# Patient Record
Sex: Female | Born: 1970 | Race: White | Hispanic: No | Marital: Married | Smoking: Never smoker
Health system: Southern US, Community
[De-identification: ages and names within clinical notes are randomized; demographics above are authoritative.]

## PROBLEM LIST (undated history)

## (undated) DIAGNOSIS — E119 Type 2 diabetes mellitus without complications: Secondary | ICD-10-CM

## (undated) HISTORY — PX: ANTERIOR CRUCIATE LIGAMENT REPAIR: SHX115

## (undated) HISTORY — PX: MENISCUS REPAIR: SHX5179

## (undated) HISTORY — PX: KNEE SURGERY: SHX244

---

## 2009-11-09 ENCOUNTER — Ambulatory Visit: Payer: Self-pay | Admitting: Family Medicine

## 2009-11-09 DIAGNOSIS — F411 Generalized anxiety disorder: Secondary | ICD-10-CM | POA: Insufficient documentation

## 2009-11-09 DIAGNOSIS — B029 Zoster without complications: Secondary | ICD-10-CM | POA: Insufficient documentation
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
## 2011-07-13 ENCOUNTER — Emergency Department
Admission: EM | Admit: 2011-07-13 | Discharge: 2011-07-13 | Discharge status: Absent without leave | Payer: Self-pay | Source: Home / Self Care

## 2012-09-06 ENCOUNTER — Encounter: Payer: Self-pay | Admitting: Emergency Medicine

## 2012-09-06 ENCOUNTER — Emergency Department (INDEPENDENT_AMBULATORY_CARE_PROVIDER_SITE_OTHER)
Admission: EM | Admit: 2012-09-06 | Discharge: 2012-09-06 | Disposition: A | Discharge status: Home / Self Care | Payer: BC Managed Care – PPO | Source: Home / Self Care | Attending: Family Medicine | Admitting: Family Medicine

## 2012-09-06 DIAGNOSIS — J309 Allergic rhinitis, unspecified: Secondary | ICD-10-CM

## 2012-09-06 DIAGNOSIS — J40 Bronchitis, not specified as acute or chronic: Secondary | ICD-10-CM

## 2012-09-06 DIAGNOSIS — J069 Acute upper respiratory infection, unspecified: Secondary | ICD-10-CM

## 2012-09-06 MED ORDER — FLUTICASONE PROPIONATE 50 MCG/ACT NA SUSP: 2.0000 | Freq: Every day | NASAL | Status: DC

## 2012-09-06 MED ORDER — CETIRIZINE HCL 10 MG PO CAPS: 10.0000 mg | ORAL_CAPSULE | Freq: Every day | ORAL | Status: DC

## 2012-09-06 MED ORDER — AZITHROMYCIN 250 MG PO TABS: ORAL_TABLET | ORAL | Status: DC

## 2012-09-06 NOTE — ED Provider Notes (Signed)
History     CSN: [id number]  Arrival date & time 09/06/12  1453   First MD Initiated Contact with Patient 09/06/12 1457      Chief Complaint  Patient presents with  . Cough   HPI  URI Symptoms Onset: 2 weeks  Description: rhinorrhea, nasal congestion, sinus drainage, mild cough  Modifying factors:  None   Symptoms Nasal discharge: yes Fever: no Sore throat: mild Cough: yes Wheezing: no Ear pain: no GI symptoms: no Sick contacts: no  Red Flags  Stiff neck: no Dyspnea: faint Rash: no Swallowing difficulty: no  Sinusitis Risk Factors Headache/face pain: no Double sickening: no tooth pain: no  Allergy Risk Factors Sneezing: yes  Itchy scratchy throat: yes Seasonal symptoms: yes  Flu Risk Factors Headache: no muscle aches: no severe fatigue: no    No past medical history on file.  No past surgical history on file.  No family history on file.  History  Substance Use Topics  . Smoking status: Not on file  . Smokeless tobacco: Not on file  . Alcohol Use: Not on file    OB History   No data available      Review of Systems  All other systems reviewed and are negative.    Allergies  Review of patient's allergies indicates no known allergies.  Home Medications   Current Outpatient Rx  Name  Route  Sig  Dispense  Refill  . citalopram (CELEXA) 40 MG tablet   Oral   Take 40 mg by mouth daily.         Marland Kitchen dextromethorphan (DELSYM) 30 MG/5ML liquid   Oral   Take 60 mg by mouth as needed for cough.         . dextromethorphan-guaiFENesin (MUCINEX DM) 30-600 MG per 12 hr tablet   Oral   Take 1 tablet by mouth every 12 (twelve) hours.           BP 133/82  Pulse 92  Temp(Src) 98.2 F (36.8 C) (Oral)  Ht 5\' 3"  (1.6 m)  SpO2 99%  Physical Exam  Constitutional: She appears well-developed and well-nourished.  HENT:  Head: Normocephalic and atraumatic.  Right Ear: External ear normal.  Left Ear: External ear normal.  +nasal  erythema, rhinorrhea bilaterally, + post oropharyngeal erythema    Eyes: Conjunctivae are normal. Pupils are equal, round, and reactive to light.  Neck: Normal range of motion.  Cardiovascular: Normal rate and regular rhythm.   Pulmonary/Chest: Effort normal and breath sounds normal. She has no wheezes.  Abdominal: Soft. Bowel sounds are normal.  Musculoskeletal: Normal range of motion.  Neurological: She is alert.  Skin: Skin is warm.    ED Course  Procedures (including critical care time)  Labs Reviewed - No data to display No results found.   1. URI (upper respiratory infection)   2. Bronchitis   3. Allergic rhinitis       MDM  Start flonase and zyrtec for allergic sxs.  Zpak for atypical coverage.  Discussed supportive care and infectious red flags.  Follow up as needed.     The patient and/or caregiver has been counseled thoroughly with regard to treatment plan and/or medications prescribed including dosage, schedule, interactions, rationale for use, and possible side effects and they verbalize understanding. Diagnoses and expected course of recovery discussed and will return if not improved as expected or if the condition worsens. Patient and/or caregiver verbalized understanding.  Doree Albee, MD 09/06/12 [phone]

## 2012-09-06 NOTE — ED Notes (Signed)
Congested cough x 2 weeks

## 2012-10-14 ENCOUNTER — Emergency Department (INDEPENDENT_AMBULATORY_CARE_PROVIDER_SITE_OTHER)
Admission: EM | Admit: 2012-10-14 | Discharge: 2012-10-14 | Disposition: A | Discharge status: Home / Self Care | Payer: BC Managed Care – PPO | Source: Home / Self Care | Attending: Family Medicine | Admitting: Family Medicine

## 2012-10-14 ENCOUNTER — Encounter: Payer: Self-pay | Admitting: *Deleted

## 2012-10-14 DIAGNOSIS — H60399 Other infective otitis externa, unspecified ear: Secondary | ICD-10-CM

## 2012-10-14 DIAGNOSIS — H6011 Cellulitis of right external ear: Secondary | ICD-10-CM

## 2012-10-14 MED ORDER — PREDNISONE 20 MG PO TABS: 20.0000 mg | ORAL_TABLET | Freq: Two times a day (BID) | ORAL | Status: DC

## 2012-10-14 MED ORDER — CEPHALEXIN 500 MG PO CAPS: 500.0000 mg | ORAL_CAPSULE | Freq: Three times a day (TID) | ORAL | Status: DC

## 2012-10-14 NOTE — ED Notes (Signed)
Pt c/o right ear pain and soreness x 5 days. Has not tried anything at home.

## 2012-10-14 NOTE — ED Provider Notes (Signed)
History     CSN: [id number]  Arrival date & time 10/14/12  1542   First MD Initiated Contact with Patient 10/14/12 1614      Chief Complaint  Patient presents with  . Otalgia       HPI Comments: Patient complains of 5 day history of right earache.  She notes that the pain is actually in her distal right canal where she has had a cystic lesion that has drained but remains tender.  No fevers, chills, and sweats.  She notes that she has had increased sinus congestion, and now has developed some mild discomfort in her left ear.  She uses Flonase daily.  Patient is a 42 y.o. female presenting with ear drainage. The history is provided by the patient.  Ear Drainage This is a new problem. Episode onset: 5 days ago. The problem occurs daily. The problem has been resolved. Associated symptoms comments: none. Exacerbated by: applying pressure to right ear. Nothing relieves the symptoms. She has tried nothing for the symptoms.    History reviewed. No pertinent past medical history.  Past Surgical History  Procedure Laterality Date  . Cesarean section      Family History  Problem Relation Age of Onset  . Cancer Mother     History  Substance Use Topics  . Smoking status: Never Smoker   . Smokeless tobacco: Not on file  . Alcohol Use: No    OB History   Grav Para Term Preterm Abortions TAB SAB Ect Mult Living                  Review of Systems  All other systems reviewed and are negative.    Allergies  Review of patient's allergies indicates no known allergies.  Home Medications   Current Outpatient Rx  Name  Route  Sig  Dispense  Refill  . azithromycin (ZITHROMAX) 250 MG tablet      Take 2 tabs PO x 1 dose, then 1 tab PO QD x 4 days   6 tablet   0   . cephALEXin (KEFLEX) 500 MG capsule   Oral   Take 1 capsule (500 mg total) by mouth 3 (three) times daily.   30 capsule   0   . Cetirizine HCl 10 MG CAPS   Oral   Take 1 capsule (10 mg total) by mouth daily.  30 capsule   3   . citalopram (CELEXA) 40 MG tablet   Oral   Take 40 mg by mouth daily.         Marland Kitchen dextromethorphan (DELSYM) 30 MG/5ML liquid   Oral   Take 60 mg by mouth as needed for cough.         . dextromethorphan-guaiFENesin (MUCINEX DM) 30-600 MG per 12 hr tablet   Oral   Take 1 tablet by mouth every 12 (twelve) hours.         . fluticasone (FLONASE) 50 MCG/ACT nasal spray   Nasal   Place 2 sprays into the nose daily.   16 g   12   . predniSONE (DELTASONE) 20 MG tablet   Oral   Take 1 tablet (20 mg total) by mouth 2 (two) times daily. Take with food.   10 tablet   0     BP 120/79  Pulse 87  Temp(Src) 97.8 F (36.6 C) (Oral)  Resp 16  Ht 5\' 3"  (1.6 m)  Wt 237 lb 12 oz (107.843 kg)  BMI 42.13 kg/m2  SpO2  97%  Physical Exam  Nursing note and vitals reviewed. Constitutional: She appears well-developed and well-nourished.  Patient is obese (BMI 42.1)  HENT:  Head: Normocephalic and atraumatic.  Right Ear: Tympanic membrane and ear canal normal. There is tenderness.  Left Ear: Tympanic membrane, external ear and ear canal normal.  Ears:  Nose: Nose normal.  Mouth/Throat: Oropharynx is clear and moist.  As noted on diagram, right ear external meatus reveals a 5mm dia excoriation without swelling that is tender to palpation.  Remainder of canal appears normal.  Eyes: Conjunctivae and EOM are normal. Pupils are equal, round, and reactive to light.  Neck: Neck supple.  Lymphadenopathy:    She has no cervical adenopathy.    ED Course  Procedures  none  Labs Reviewed - Tympanogram high peak height bilaterally     1. Cellulitis of ear canal, right       MDM  Begin prednisone burst. Begin Keflex. Apply warm compress to right ear two or three times daily. Followup with ENT if not improved about one week. Note abnormal tympanogram; recommend that she follow-up with ENT for evaluation of her hearing.        Lattie Haw, MD 10/18/12  [phone]

## 2012-10-21 ENCOUNTER — Emergency Department (INDEPENDENT_AMBULATORY_CARE_PROVIDER_SITE_OTHER)
Admission: EM | Admit: 2012-10-21 | Discharge: 2012-10-21 | Disposition: A | Discharge status: Home / Self Care | Payer: BC Managed Care – PPO | Source: Home / Self Care | Attending: Family Medicine | Admitting: Family Medicine

## 2012-10-21 ENCOUNTER — Encounter: Payer: Self-pay | Admitting: *Deleted

## 2012-10-21 DIAGNOSIS — H60399 Other infective otitis externa, unspecified ear: Secondary | ICD-10-CM

## 2012-10-21 DIAGNOSIS — H60391 Other infective otitis externa, right ear: Secondary | ICD-10-CM

## 2012-10-21 MED ORDER — CIPROFLOXACIN-DEXAMETHASONE 0.3-0.1 % OT SUSP: 4.0000 [drp] | Freq: Two times a day (BID) | OTIC | Status: DC

## 2012-10-21 NOTE — ED Notes (Signed)
Pt c/o RT ear drainage and soreness no better from her visit last week. Denies fever.

## 2012-10-21 NOTE — ED Provider Notes (Signed)
History     CSN: [id number]  Arrival date & time 10/21/12  1545   First MD Initiated Contact with Patient 10/21/12 1557      Chief Complaint  Patient presents with  . Ear Drainage       HPI Comments: Patient reports that she has had no improvement while taking Keflex, and still has soreness of her right ear canal.  She continues to have small amount of drainage from right ear.  No fevers, chills, and sweats   Patient is a 42 y.o. female presenting with ear pain. The history is provided by the patient.  Otalgia Location:  Right Behind ear:  No abnormality Quality:  Sore Severity:  Mild Duration: since last visit. Timing:  Constant Progression:  Unchanged Chronicity:  Chronic Relieved by:  Nothing Associated symptoms: ear discharge and rhinorrhea   Associated symptoms: no fever, no headaches and no hearing loss     History reviewed. No pertinent past medical history.  Past Surgical History  Procedure Laterality Date  . Cesarean section      Family History  Problem Relation Age of Onset  . Cancer Mother     History  Substance Use Topics  . Smoking status: Never Smoker   . Smokeless tobacco: Not on file  . Alcohol Use: No    OB History   Grav Para Term Preterm Abortions TAB SAB Ect Mult Living                  Review of Systems  Constitutional: Negative for fever.  HENT: Positive for ear pain, rhinorrhea and ear discharge. Negative for hearing loss.   Neurological: Negative for headaches.  All other systems reviewed and are negative.    Allergies  Review of patient's allergies indicates no known allergies.  Home Medications   Current Outpatient Rx  Name  Route  Sig  Dispense  Refill  . azithromycin (ZITHROMAX) 250 MG tablet      Take 2 tabs PO x 1 dose, then 1 tab PO QD x 4 days   6 tablet   0   . cephALEXin (KEFLEX) 500 MG capsule   Oral   Take 1 capsule (500 mg total) by mouth 3 (three) times daily.   30 capsule   0   . Cetirizine HCl 10  MG CAPS   Oral   Take 1 capsule (10 mg total) by mouth daily.   30 capsule   3   . ciprofloxacin-dexamethasone (CIPRODEX) otic suspension   Right Ear   Place 4 drops into the right ear 2 (two) times daily. Use for one week.   7.5 mL   0   . citalopram (CELEXA) 40 MG tablet   Oral   Take 40 mg by mouth daily.         Marland Kitchen dextromethorphan (DELSYM) 30 MG/5ML liquid   Oral   Take 60 mg by mouth as needed for cough.         . dextromethorphan-guaiFENesin (MUCINEX DM) 30-600 MG per 12 hr tablet   Oral   Take 1 tablet by mouth every 12 (twelve) hours.         . fluticasone (FLONASE) 50 MCG/ACT nasal spray   Nasal   Place 2 sprays into the nose daily.   16 g   12   . predniSONE (DELTASONE) 20 MG tablet   Oral   Take 1 tablet (20 mg total) by mouth 2 (two) times daily. Take with food.   10  tablet   0     BP 123/79  Pulse 98  Temp(Src) 97.7 F (36.5 C) (Oral)  Resp 18  Ht 5\' 3"  (1.6 m)  Wt 237 lb (107.502 kg)  BMI 41.99 kg/m2  SpO2 97%  LMP 10/13/2012  Physical Exam Nursing notes and Vital Signs reviewed. Appearance:  Patient appears stated age, and in no acute distress.  Patient is obese (BMI 42.0) Eyes:  Pupils are equal, round, and reactive to light and accomodation.  Extraocular movement is intact.  Conjunctivae are not inflamed  Right ear:  Whitish exudate at outer one third of canal.  There is tenderness with insertion of speculum. No lesions noted.  No edema of canal.  Right tympanic membrane normal.  Nose:   Normal  Pharynx:  Normal Neck:  Supple.  No adenopathy Skin:  No rash present.   ED Course  Procedures  none      1. Otitis, externa, infective, right       MDM  Begin Ciprodex Otic susp.  Culture pending Followup with ENT if not resolved one week.        Lattie Haw, MD 10/21/12 [phone]

## 2012-10-26 LAB — WOUND CULTURE
Gram Stain: NONE SEEN
Gram Stain: NONE SEEN

## 2012-10-27 ENCOUNTER — Telehealth: Payer: Self-pay | Admitting: *Deleted

## 2013-01-25 DIAGNOSIS — M5418 Radiculopathy, sacral and sacrococcygeal region: Secondary | ICD-10-CM | POA: Insufficient documentation

## 2013-01-25 DIAGNOSIS — M5136 Other intervertebral disc degeneration, lumbar region: Secondary | ICD-10-CM | POA: Insufficient documentation

## 2015-10-15 DIAGNOSIS — K219 Gastro-esophageal reflux disease without esophagitis: Secondary | ICD-10-CM | POA: Insufficient documentation

## 2015-10-15 DIAGNOSIS — R609 Edema, unspecified: Secondary | ICD-10-CM | POA: Insufficient documentation

## 2015-10-15 DIAGNOSIS — E1169 Type 2 diabetes mellitus with other specified complication: Secondary | ICD-10-CM | POA: Insufficient documentation

## 2016-04-11 DIAGNOSIS — E1165 Type 2 diabetes mellitus with hyperglycemia: Secondary | ICD-10-CM | POA: Insufficient documentation

## 2016-06-09 ENCOUNTER — Emergency Department (INDEPENDENT_AMBULATORY_CARE_PROVIDER_SITE_OTHER): Payer: BLUE CROSS/BLUE SHIELD

## 2016-06-09 ENCOUNTER — Encounter: Payer: Self-pay | Admitting: Emergency Medicine

## 2016-06-09 ENCOUNTER — Emergency Department (INDEPENDENT_AMBULATORY_CARE_PROVIDER_SITE_OTHER)
Admission: EM | Admit: 2016-06-09 | Discharge: 2016-06-09 | Disposition: A | Discharge status: Home / Self Care | Payer: BLUE CROSS/BLUE SHIELD | Source: Home / Self Care | Attending: Family Medicine | Admitting: Family Medicine

## 2016-06-09 DIAGNOSIS — I517 Cardiomegaly: Secondary | ICD-10-CM

## 2016-06-09 DIAGNOSIS — J069 Acute upper respiratory infection, unspecified: Secondary | ICD-10-CM

## 2016-06-09 DIAGNOSIS — R053 Chronic cough: Secondary | ICD-10-CM

## 2016-06-09 DIAGNOSIS — R05 Cough: Secondary | ICD-10-CM

## 2016-06-09 MED ORDER — BENZONATATE 100 MG PO CAPS: 100.0000 mg | ORAL_CAPSULE | Freq: Three times a day (TID) | ORAL | 0 refills | Status: DC

## 2016-06-09 MED ORDER — AZITHROMYCIN 250 MG PO TABS: 250.0000 mg | ORAL_TABLET | Freq: Every day | ORAL | 0 refills | Status: DC

## 2016-06-09 NOTE — ED Provider Notes (Signed)
CSN: [id number]     Arrival date & time 06/09/16  1005 History   First MD Initiated Contact with Patient 06/09/16 1128     Chief Complaint  Patient presents with  . Cough   (Consider location/radiation/quality/duration/timing/severity/associated sxs/prior Treatment) HPI Molly Andrews is a 45 y.o. female presenting to UC with c/o mild to moderate intermittent dry cough that is occasionally productive for about 6 weeks. She notes she initially had a lot of congestion and fever when symptoms first started, she started to improve and thought her symptoms had resolved, however, her cough has persisted.  Denies recent fever, chills, n/v/d over the last 1 week.  She has tried OTC cough/cold medication with mild temporary relief.    History reviewed. No pertinent past medical history. Past Surgical History:  Procedure Laterality Date  . CESAREAN SECTION     Family History  Problem Relation Age of Onset  . Cancer Mother    Social History  Substance Use Topics  . Smoking status: Never Smoker  . Smokeless tobacco: Never Used  . Alcohol use No   OB History    No data available     Review of Systems  Constitutional: Negative for chills and fever.  HENT: Positive for congestion. Negative for ear pain, sore throat, trouble swallowing and voice change.   Respiratory: Positive for cough. Negative for shortness of breath.   Cardiovascular: Negative for chest pain and palpitations.  Gastrointestinal: Negative for abdominal pain, diarrhea, nausea and vomiting.  Musculoskeletal: Negative for arthralgias, back pain and myalgias.  Skin: Negative for rash.    Allergies  Patient has no known allergies.  Home Medications   Prior to Admission medications   Medication Sig Start Date End Date Taking? Authorizing Provider  azithromycin (ZITHROMAX) 250 MG tablet Take 1 tablet (250 mg total) by mouth daily. Take first 2 tablets together, then 1 every day until finished. 06/09/16   Junius FinnerErin  O'Malley, PA-C  benzonatate (TESSALON) 100 MG capsule Take 1-2 capsules (100-200 mg total) by mouth every 8 (eight) hours. 06/09/16   Junius FinnerErin O'Malley, PA-C  Cetirizine HCl 10 MG CAPS Take 1 capsule (10 mg total) by mouth daily. 09/06/12   Floydene FlockSteven J Newton, MD  ciprofloxacin-dexamethasone [facility](CIPRODEX) otic suspension Place 4 drops into the right ear 2 (two) times daily. Use for one week. 10/21/12   Lattie HawStephen A Beese, MD  dextromethorphan (DELSYM) 30 MG/5ML liquid Take 60 mg by mouth as needed for cough.    Historical Provider, MD  dextromethorphan-guaiFENesin (MUCINEX DM) 30-600 MG per 12 hr tablet Take 1 tablet by mouth every 12 (twelve) hours.    Historical Provider, MD  fluticasone (FLONASE) 50 MCG/ACT nasal spray Place 2 sprays into the nose daily. 09/06/12   Floydene FlockSteven J Newton, MD  predniSONE (DELTASONE) 20 MG tablet Take 1 tablet (20 mg total) by mouth 2 (two) times daily. Take with food. 10/14/12   Lattie HawStephen A Beese, MD   Meds Ordered and Administered this Visit  Medications - No data to display  BP 137/80 (BP Location: Left Arm)   Pulse 87   Temp 98.1 F (36.7 C) (Oral)   Ht 5\' 3"  (1.6 m)   Wt 256 lb (116.1 kg)   LMP 06/07/2016 (Exact Date)   SpO2 97%   BMI 45.35 kg/m  No data found.   Physical Exam  Constitutional: She appears well-developed and well-nourished. No distress.  HENT:  Head: Normocephalic and atraumatic.  Right Ear: Tympanic membrane normal.  Left Ear: Tympanic membrane normal.  Nose: Nose normal.  Mouth/Throat: Uvula is midline, oropharynx is clear and moist and mucous membranes are normal.  Eyes: Conjunctivae are normal. No scleral icterus.  Neck: Normal range of motion. Neck supple.  Cardiovascular: Normal rate, regular rhythm and normal heart sounds.   Pulmonary/Chest: Effort normal and breath sounds normal. No stridor. No respiratory distress. She has no wheezes. She has no rales.  Musculoskeletal: Normal range of motion.  Lymphadenopathy:    She has no cervical  adenopathy.  Neurological: She is alert.  Skin: Skin is warm and dry. She is not diaphoretic.  Nursing note and vitals reviewed.   Urgent Care Course   Clinical Course     Procedures (including critical care time)  Labs Review Labs Reviewed - No data to display  Imaging Review Dg Chest 2 View  Result Date: 06/09/2016 CLINICAL DATA:  Cough. EXAM: CHEST  2 VIEW COMPARISON:  No prior. FINDINGS: Mediastinum and hilar structures normal. Lungs are clear. Cardiomegaly with normal pulmonary vascularity. No pleural effusion or pneumothorax . IMPRESSION: Cardiomegaly with normal pulmonary vascularity. No acute pulmonary disease. Electronically Signed   By: Maisie Fushomas  Register   On: 06/09/2016 11:45    MDM   1. Upper respiratory tract infection, unspecified type   2. Persistent cough for 3 weeks or longer   3. Cardiomegaly    Pt c/o persistent cough for about 6 weeks following fever and congestion initially.    CXR: no evidence of pneumonia, however, there is evidence of cardiomegaly.  Pt denies known hx of enlarged heart. Denies chest pain or SOB.   Encouraged to f/u with PCP about further evaluation of cardiomegaly.  For cough, due to reports of initial fever and congestion, will cover for atypical bacterial cause. Rx: Azithromycin and tessalon    Junius Finnerrin O'Malley, PA-C 06/09/16 1245

## 2016-06-09 NOTE — ED Triage Notes (Signed)
Dry cough x 6 weeks 

## 2017-02-02 ENCOUNTER — Encounter: Payer: Self-pay | Admitting: *Deleted

## 2017-02-02 ENCOUNTER — Emergency Department
Admission: EM | Admit: 2017-02-02 | Discharge: 2017-02-02 | Disposition: A | Discharge status: Home / Self Care | Payer: BLUE CROSS/BLUE SHIELD | Source: Home / Self Care | Attending: Family Medicine | Admitting: Family Medicine

## 2017-02-02 DIAGNOSIS — J069 Acute upper respiratory infection, unspecified: Secondary | ICD-10-CM

## 2017-02-02 DIAGNOSIS — B9789 Other viral agents as the cause of diseases classified elsewhere: Secondary | ICD-10-CM | POA: Diagnosis not present

## 2017-02-02 DIAGNOSIS — R0981 Nasal congestion: Secondary | ICD-10-CM

## 2017-02-02 HISTORY — DX: Type 2 diabetes mellitus without complications: E11.9

## 2017-02-02 MED ORDER — PREDNISONE 20 MG PO TABS: ORAL_TABLET | ORAL | 0 refills | Status: DC

## 2017-02-02 MED ORDER — GUAIFENESIN ER 600 MG PO TB12
600.0000 mg | ORAL_TABLET | Freq: Two times a day (BID) | ORAL | 0 refills | Status: DC | PRN
Start: 2017-02-02 — End: 2017-11-13

## 2017-02-02 MED ORDER — AZITHROMYCIN 250 MG PO TABS: 250.0000 mg | ORAL_TABLET | Freq: Every day | ORAL | 0 refills | Status: DC

## 2017-02-02 MED ORDER — IPRATROPIUM BROMIDE 0.06 % NA SOLN: 2.0000 | Freq: Four times a day (QID) | NASAL | 2 refills | Status: DC

## 2017-02-02 NOTE — ED Triage Notes (Signed)
Pt c/o nasal congestion, sinus pressure, sneezing, and HA x 3 days with some minimal coughing today. Denies fever.

## 2017-02-02 NOTE — ED Provider Notes (Signed)
[name]    CSN: [id number] Arrival date & time: 02/02/17  1654   History   Chief Complaint Chief Complaint  Patient presents with  . Nasal Congestion    HPI Molly Andrews is a 46 y.o. female.   HPI  Molly Andrews is a 46 y.o. female presenting to UC with c/o gradually worsening nasal congestion with sinus pressure, sneezing, post-nasal drip and mild intermittent cough for 3 days.  She tried OTC Coricidin but no relief yesterday so she took Advil and used Afrin with mild relief. Pt states the Afrin caused her nose to burn. Denies fever, chills, n/v/d. Friends have been sick but no one in her household.    Past Medical History:  Diagnosis Date  . Diabetes mellitus without complication [facility])     Patient Active Problem List   Diagnosis Date Noted  . 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 ANXIETY 11/09/2009    Past Surgical History:  Procedure Laterality Date  . CESAREAN SECTION      OB History    No data available       Home Medications    Prior to Admission medications   Medication Sig Start Date End Date Taking? Authorizing Provider  azithromycin (ZITHROMAX) 250 MG tablet Take 1 tablet (250 mg total) by mouth daily. Take first 2 tablets together, then 1 every day until finished. 02/02/17   Lurene Shadow, PA-C  Cetirizine HCl 10 MG CAPS Take 1 capsule (10 mg total) by mouth daily. 09/06/12   Floydene Flock, MD  fluticasone (FLONASE) 50 MCG/ACT nasal spray Place 2 sprays into the nose daily. 09/06/12   Floydene Flock, MD  guaiFENesin (MUCINEX) 600 MG 12 hr tablet Take 1-2 tablets (600-1,200 mg total) by mouth 2 (two) times daily as needed for cough or to loosen phlegm. 02/02/17   Lurene Shadow, PA-C  ipratropium (ATROVENT) 0.06 % nasal spray Place 2 sprays into both nostrils 4 (four) times daily. 02/02/17   Lurene Shadow, PA-C  predniSONE (DELTASONE) 20 MG tablet 3 tabs po day one, then 2 po daily x 4 days 02/02/17   Lurene Shadow, PA-C    Family  History Family History  Problem Relation Age of Onset  . Cancer Mother     Social History Social History  Substance Use Topics  . Smoking status: Never Smoker  . Smokeless tobacco: Never Used  . Alcohol use Yes     Comment: 4 q wk     Allergies   Patient has no known allergies.   Review of Systems Review of Systems  Constitutional: Negative for chills and fever.  HENT: Positive for congestion, postnasal drip, sinus pain, sinus pressure and sore throat. Negative for ear pain, trouble swallowing and voice change.   Respiratory: Positive for cough. Negative for shortness of breath.   Cardiovascular: Negative for chest pain and palpitations.  Gastrointestinal: Negative for abdominal pain, diarrhea, nausea and vomiting.  Musculoskeletal: Negative for arthralgias, back pain and myalgias.  Skin: Negative for rash.  Neurological: Positive for headaches. Negative for dizziness and light-headedness.     Physical Exam Triage Vital Signs ED Triage Vitals  Enc Vitals Group     BP 02/02/17 1726 131/83     Pulse Rate 02/02/17 1726 99     Resp 02/02/17 1726 18     Temp 02/02/17 1726 97.7 F (36.5 C)     Temp Source 02/02/17 1726 Oral     SpO2 02/02/17 1726 98 %  Weight 02/02/17 1728 259 lb (117.5 kg)     Height 02/02/17 1728 5\' 3"  (1.6 m)     Head Circumference --      Peak Flow --      Pain Score 02/02/17 1728 0     Pain Loc --      Pain Edu? --      Excl. in GC? --    No data found.   Updated Vital Signs BP 131/83 (BP Location: Left Arm)   Pulse 99   Temp 97.7 F (36.5 C) (Oral)   Resp 18   Ht 5\' 3"  (1.6 m)   Wt 259 lb (117.5 kg)   LMP 01/09/2017   SpO2 98%   BMI 45.88 kg/m      Physical Exam  Constitutional: She is oriented to person, place, and time. She appears well-developed and well-nourished. No distress.  HENT:  Head: Normocephalic and atraumatic.  Right Ear: Tympanic membrane normal.  Left Ear: Tympanic membrane normal.  Nose: Mucosal edema  present. Right sinus exhibits no maxillary sinus tenderness and no frontal sinus tenderness. Left sinus exhibits no maxillary sinus tenderness and no frontal sinus tenderness.  Mouth/Throat: Uvula is midline, oropharynx is clear and moist and mucous membranes are normal.  Eyes: EOM are normal.  Neck: Normal range of motion. Neck supple.  Cardiovascular: Normal rate and regular rhythm.   Pulmonary/Chest: Effort normal and breath sounds normal. No stridor. No respiratory distress. She has no wheezes. She has no rales.  Musculoskeletal: Normal range of motion.  Lymphadenopathy:    She has no cervical adenopathy.  Neurological: She is alert and oriented to person, place, and time.  Skin: Skin is warm and dry. She is not diaphoretic.  Psychiatric: She has a normal mood and affect. Her behavior is normal.  Nursing note and vitals reviewed.    UC Treatments / Results  Labs (all labs ordered are listed, but only abnormal results are displayed) Labs Reviewed - No data to display  EKG  EKG Interpretation None       Radiology No results found.  Procedures Procedures (including critical care time)  Medications Ordered in UC Medications - No data to display   Initial Impression / Assessment and Plan / UC Course  I have reviewed the triage vital signs and the nursing notes.  Pertinent labs & imaging results that were available during my care of the patient were reviewed by me and considered in my medical decision making (see chart for details).     Hx and exam c/w viral illness. encouraged symptomatic treatment.   Final Clinical Impressions(s) / UC Diagnoses   Final diagnoses:  Viral URI with cough  Nasal congestion   Prescription to hold with expiration date for azithromycin. Pt to fill if persistent fever develops or not improving in 1 week.  F/u with PCP in 7-10 days if not improving.   New Prescriptions Discharge Medication List as of 02/02/2017  5:39 PM    START  taking these medications   Details  azithromycin (ZITHROMAX) 250 MG tablet Take 1 tablet (250 mg total) by mouth daily. Take first 2 tablets together, then 1 every day until finished., Starting Tue 02/02/2017, Print    guaiFENesin (MUCINEX) 600 MG 12 hr tablet Take 1-2 tablets (600-1,200 mg total) by mouth 2 (two) times daily as needed for cough or to loosen phlegm., Starting Tue 02/02/2017, Normal    ipratropium (ATROVENT) 0.06 % nasal spray Place 2 sprays into both nostrils 4 (four)  times daily., Starting Tue 02/02/2017, Normal    predniSONE (DELTASONE) 20 MG tablet 3 tabs po day one, then 2 po daily x 4 days, Normal         Controlled Substance Prescriptions [place] Controlled Substance Registry consulted? Not Applicable   Rolla Plate 02/02/17 1743

## 2017-02-02 NOTE — Discharge Instructions (Signed)
°  Your symptoms are likely due to a virus such as the common cold, however, if you developing worsening chest congestion with shortness of breath, persistent fever (>100.4*F) for 3 days, or symptoms not improving in 4-5 days despite symptomatic treatment, you may fill the antibiotic (azithromycin).  If you do fill the antibiotic,  please take antibiotics as prescribed and be sure to complete entire course even if you start to feel better to ensure infection does not come back.  You may take 500mg  acetaminophen every 4-6 hours or in combination with ibuprofen 400-600mg  every 6-8 hours as needed for pain, inflammation, and fever.  Be sure to drink at least eight 8oz glasses of water to stay well hydrated and get at least 8 hours of sleep at night, preferably more while sick.

## 2017-11-13 ENCOUNTER — Encounter: Payer: Self-pay | Admitting: Emergency Medicine

## 2017-11-13 ENCOUNTER — Emergency Department
Admission: EM | Admit: 2017-11-13 | Discharge: 2017-11-13 | Disposition: A | Discharge status: Home / Self Care | Payer: BLUE CROSS/BLUE SHIELD | Source: Home / Self Care | Attending: Family Medicine | Admitting: Family Medicine

## 2017-11-13 DIAGNOSIS — L299 Pruritus, unspecified: Secondary | ICD-10-CM | POA: Diagnosis not present

## 2017-11-13 LAB — POCT FASTING CBG KUC MANUAL ENTRY: POCT Glucose (KUC): 215 mg/dL — AB (ref 70–99)

## 2017-11-13 MED ORDER — CETIRIZINE HCL 10 MG PO TABS: 10.0000 mg | ORAL_TABLET | Freq: Every day | ORAL | 0 refills | Status: DC

## 2017-11-13 NOTE — ED Provider Notes (Signed)
[name]    CSN: [id number] Arrival date & time: 11/13/17  1253     History   Chief Complaint Chief Complaint  Patient presents with  . Rash    HPI Gurnoor E Kervin is a 47 y.o. female.   HPI  Mylinda Brook Laymon is a 47 y.o. female presenting to UC with c/o moderate to severe itching on the bottoms of her hands and feet for 2 days.  She was outside yesterday and noticed a few mosquito bites on her fingers but she notes the itching on the bottom of her feet is most severe.  She took benadryl 1 hour PTA, which seems to be helping but she has not tried any other medications until today.  She notes she was started on Farxiga 5mg  about 3 weeks ago for her diabetes and wonders if that could be contributing to her itching.  Denies other new soaps, lotions, or medications.  Her sugar has been running in the 200s the last few weeks, which is high for her.  She has not checked her sugar today and has not had anything to eat today, just coffee.     Past Medical History:  Diagnosis Date  . Diabetes mellitus without complication [facility])     Patient Active Problem List   Diagnosis Date Noted  . 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 ANXIETY 11/09/2009    Past Surgical History:  Procedure Laterality Date  . ANTERIOR CRUCIATE LIGAMENT REPAIR    . CESAREAN SECTION    . KNEE SURGERY    . MENISCUS REPAIR      OB History   None      Home Medications    Prior to Admission medications   Medication Sig Start Date End Date Taking? Authorizing Provider  citalopram (CELEXA) 40 MG tablet  10/15/17  Yes [provider]  FARXIGA 5 MG TABS tablet  10/15/17  Yes [provider]  cetirizine (ZYRTEC) 10 MG tablet Take 1 tablet (10 mg total) by mouth daily. 11/13/17   Lurene Shadow, PA-C    Family History Family History  Problem Relation Age of Onset  . Cancer Mother     Social History Social History   Tobacco Use  . Smoking status: Never Smoker  . Smokeless tobacco:  Never Used  Substance Use Topics  . Alcohol use: Yes    Comment: 4 q wk  . Drug use: No     Allergies   Patient has no known allergies.   Review of Systems Review of Systems  Constitutional: Negative for chills and fever.  Musculoskeletal: Negative for arthralgias, joint swelling and myalgias.  Skin: Negative for color change and wound.       Itching on palms and soles     Physical Exam Triage Vital Signs ED Triage Vitals  Enc Vitals Group     BP 11/13/17 1354 133/83     Pulse Rate 11/13/17 1354 73     Resp --      Temp 11/13/17 1354 98.2 F (36.8 C)     Temp Source 11/13/17 1354 Oral     SpO2 11/13/17 1354 98 %     Weight 11/13/17 1355 245 lb 8 oz (111.4 kg)     Height 11/13/17 1355 5\' 3"  (1.6 m)     Head Circumference --      Peak Flow --      Pain Score 11/13/17 1355 0     Pain Loc --  Pain Edu? --      Excl. in GC? --    No data found.  Updated Vital Signs BP 133/83 (BP Location: Right Arm)   Pulse 73   Temp 98.2 F (36.8 C) (Oral)   Ht 5\' 3"  (1.6 m)   Wt 245 lb 8 oz (111.4 kg)   LMP 10/13/2017   SpO2 98%   BMI 43.49 kg/m   Visual Acuity Right Eye Distance:   Left Eye Distance:   Bilateral Distance:    Right Eye Near:   Left Eye Near:    Bilateral Near:     Physical Exam  Constitutional: She is oriented to person, place, and time. She appears well-developed and well-nourished. No distress.  HENT:  Head: Normocephalic and atraumatic.  Eyes: EOM are normal.  Neck: Normal range of motion.  Cardiovascular: Normal rate.  Pulmonary/Chest: Effort normal.  Musculoskeletal: Normal range of motion. She exhibits no edema or tenderness.  Neurological: She is alert and oriented to person, place, and time.  Skin: Skin is warm and dry. Capillary refill takes less than 2 seconds. She is not diaphoretic. No erythema.  Bilateral hands: two erythematous papules on Right ring and little finger c/w insect bites but not in web spaces. No other rashes or  lesions. Skin in tact. No bleeding or discharge.  Bilateral feet: skin in tact. No erythema or ecchymosis. Dried skin on soles for feet  Psychiatric: She has a normal mood and affect. Her behavior is normal.  Nursing note and vitals reviewed.    UC Treatments / Results  Labs (all labs ordered are listed, but only abnormal results are displayed) Labs Reviewed  POCT FASTING CBG [facility] MANUAL ENTRY - Abnormal; Notable for the following components:      Result Value   POCT Glucose ([facility]) 215 (*)    All other components within normal limits    EKG None  Radiology No results found.  Procedures Procedures (including critical care time)  Medications Ordered in UC Medications - No data to display  Initial Impression / Assessment and Plan / UC Course  I have reviewed the triage vital signs and the nursing notes.  Pertinent labs & imaging results that were available during my care of the patient were reviewed by me and considered in my medical decision making (see chart for details).     No evidence of underlying bacterial infection. No rash on feet or palms of hands. Per UpToDate, itching is not likely a side effect of new medication Farxiga. No emergent or urgent process taking place/ Home care instructions provided below.   Final Clinical Impressions(s) / UC Diagnoses   Final diagnoses:  Itching     Discharge Instructions      Please start taking the antihistamine, cetirizine, as prescribed to see if this helps with the itching.  Also, make sure your skin is moisturized and you stay well hydrated.  Dried skin can become quite itching and scratching only continues the cycle of dryness and itching.   Please follow up with your family doctor in the next 1-2 weeks for recheck of symptoms, especially if you believe your sugars are not under control with the new medication.    ED Prescriptions    Medication Sig Dispense Auth. Provider   cetirizine (ZYRTEC) 10 MG tablet Take 1  tablet (10 mg total) by mouth daily. 30 tablet Lurene ShadowPhelps, Crysten Kaman O, PA-C     Controlled Substance Prescriptions [place] Controlled Substance Registry consulted? Not Applicable   Doroteo GlassmanPhelps,  Vangie Bicker, PA-C 11/13/17 1655

## 2017-11-13 NOTE — ED Triage Notes (Signed)
Patient c/o severe itching on the bottoms of her feet and hands x 2 days.  The only thing different patient has changed is a new medication called Farxiga 5mg  about 3 weeks ago.  Blood sugars are running high.

## 2017-11-13 NOTE — Discharge Instructions (Addendum)
°  Please start taking the antihistamine, cetirizine, as prescribed to see if this helps with the itching.  Also, make sure your skin is moisturized and you stay well hydrated.  Dried skin can become quite itching and scratching only continues the cycle of dryness and itching.   Please follow up with your family doctor in the next 1-2 weeks for recheck of symptoms, especially if you believe your sugars are not under control with the new medication.

## 2017-11-18 DIAGNOSIS — M7989 Other specified soft tissue disorders: Secondary | ICD-10-CM | POA: Insufficient documentation

## 2018-03-01 IMAGING — DX DG CHEST 2V
2 series · 2 of 2 positions shown · non-contrast
Comparison: No prior.

CLINICAL DATA: Cough.

EXAM:
CHEST  2 VIEW

[chest pa]
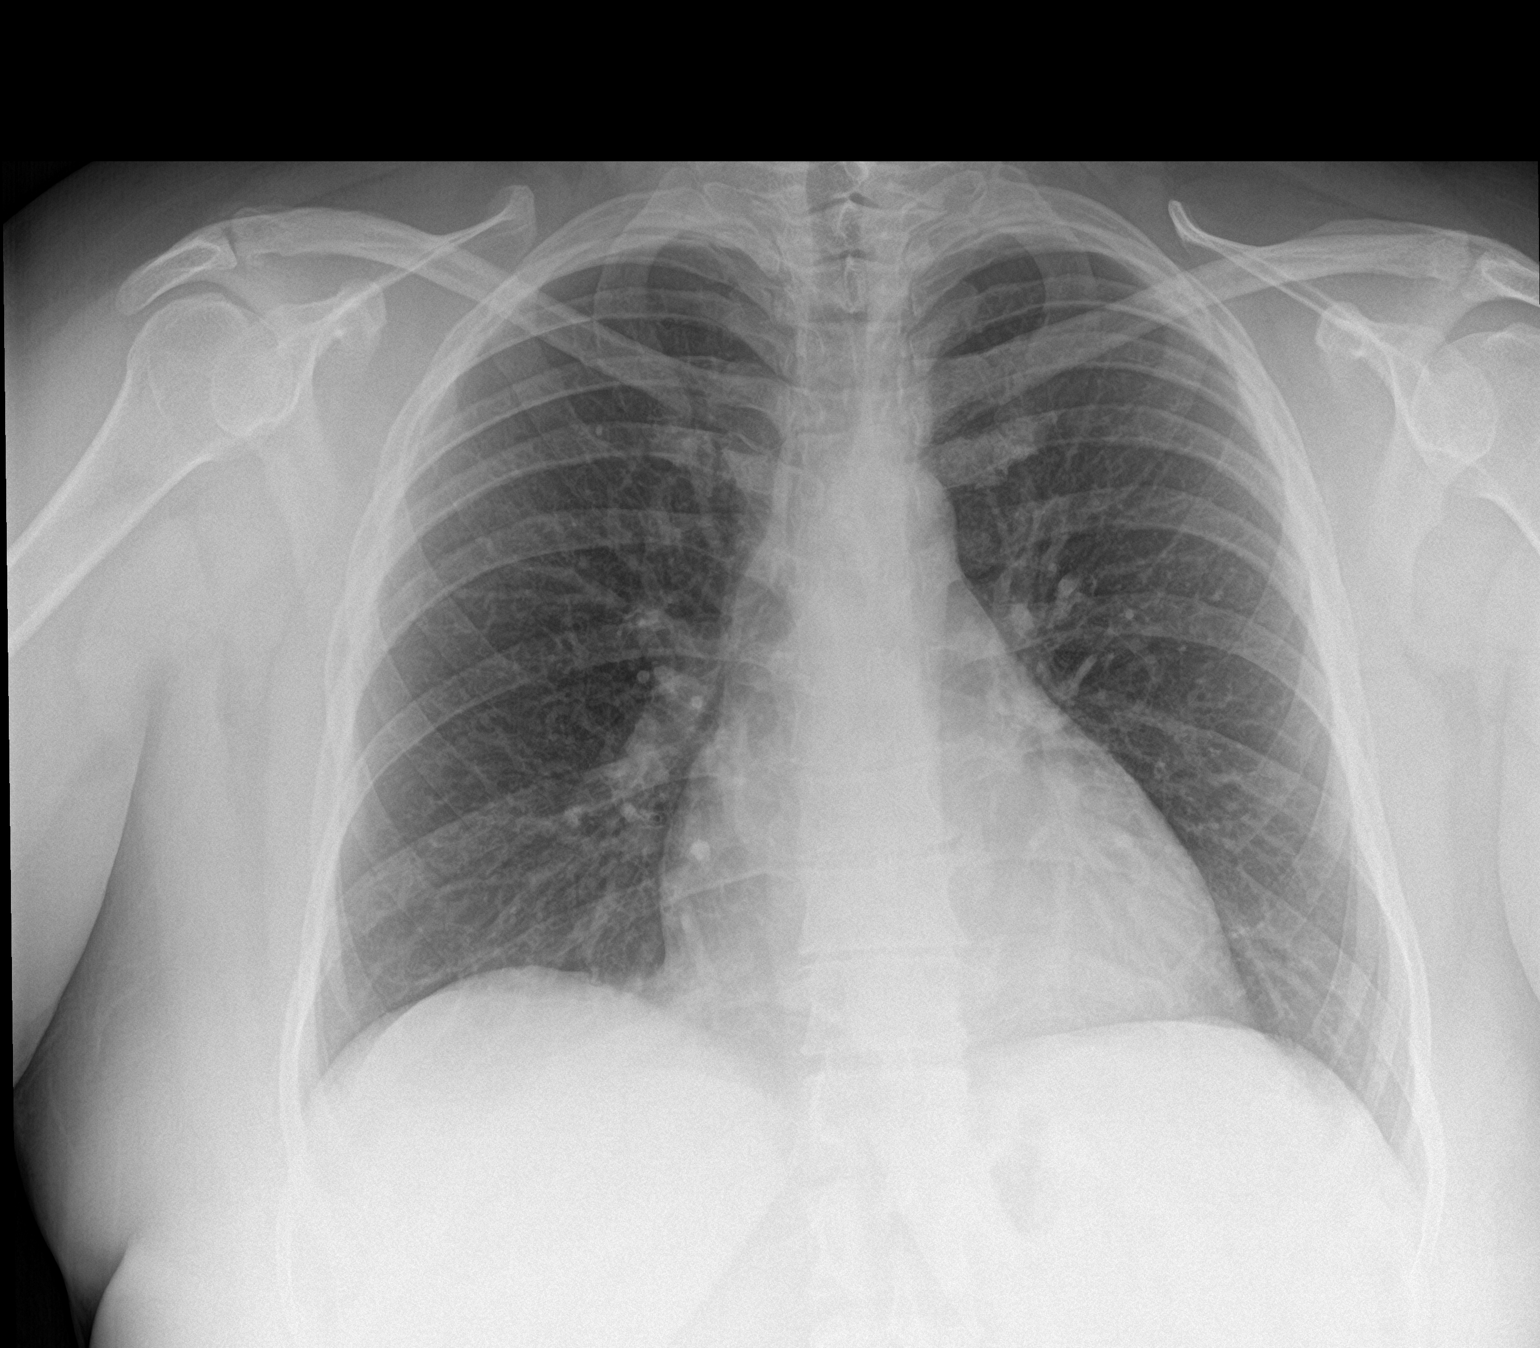

[chest lat]
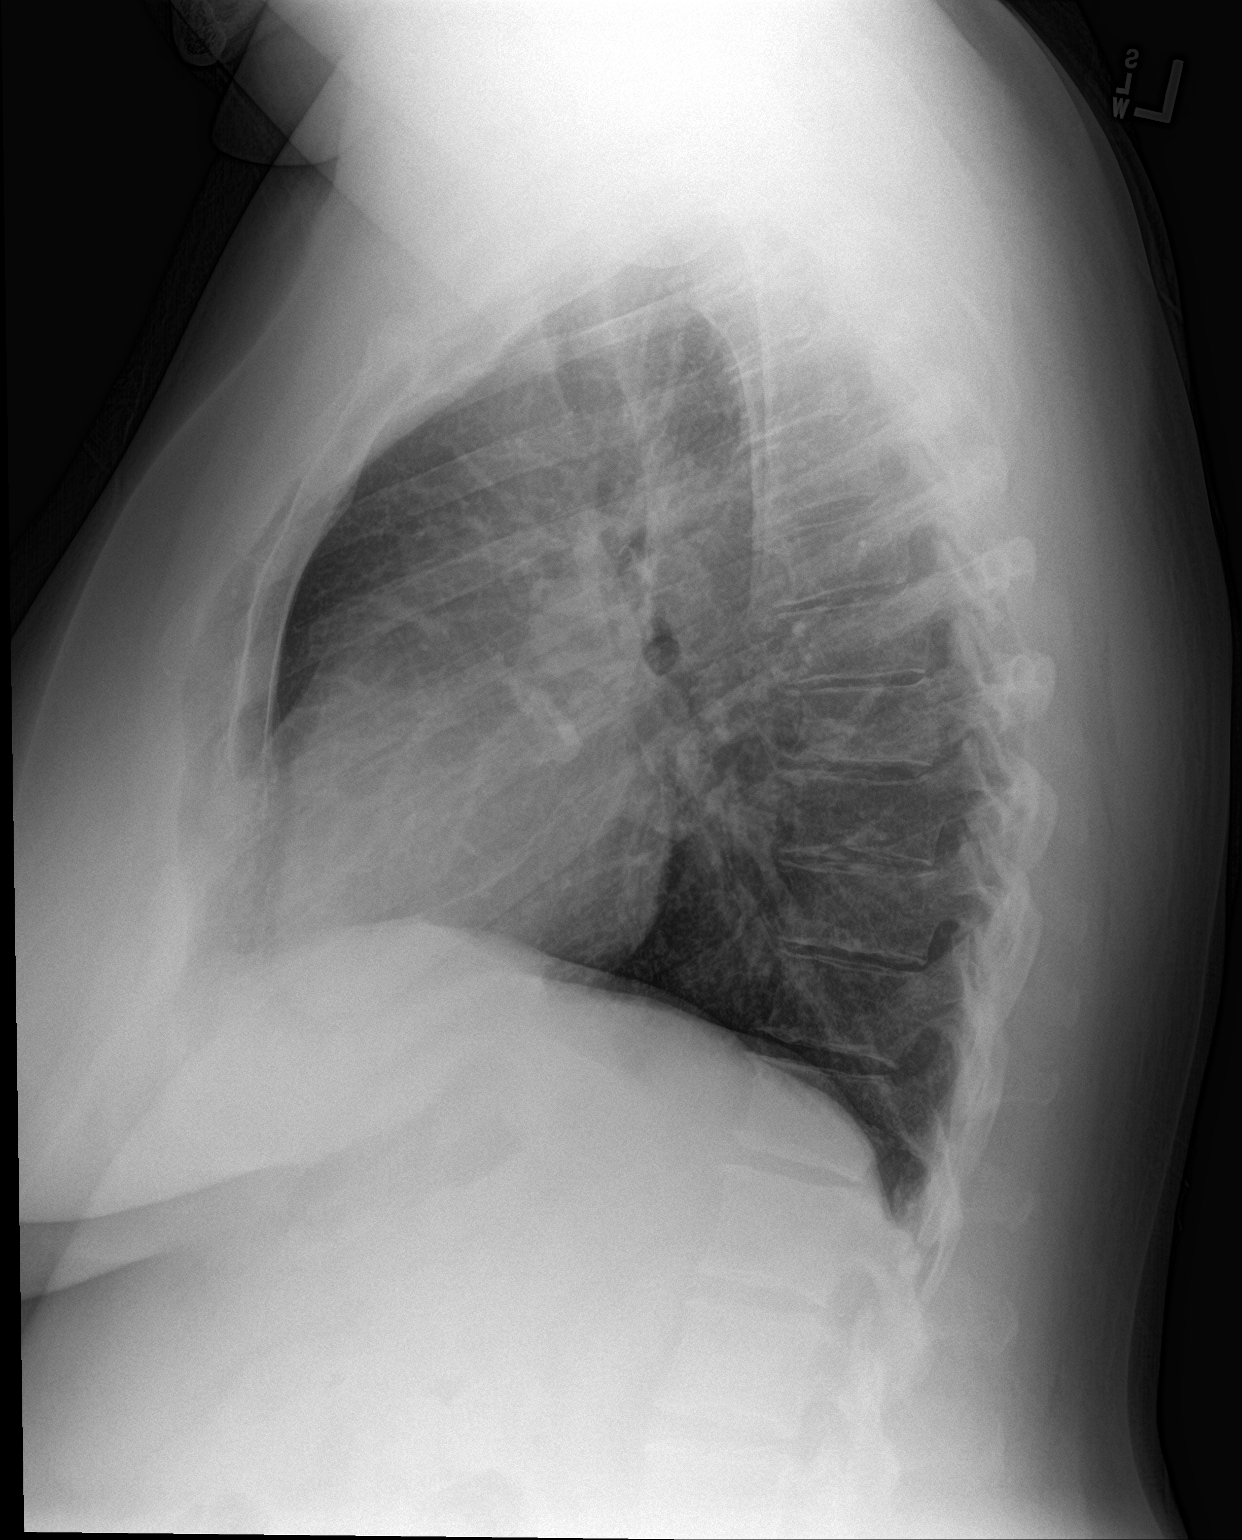

[2 of 2 positions shown; findings below may reference images not displayed]

FINDINGS: Mediastinum and hilar structures normal. Lungs are clear.
Cardiomegaly with normal pulmonary vascularity. No pleural effusion
or pneumothorax .
IMPRESSION: Cardiomegaly with normal pulmonary vascularity. No acute pulmonary
disease.

## 2018-08-03 DIAGNOSIS — M19079 Primary osteoarthritis, unspecified ankle and foot: Secondary | ICD-10-CM | POA: Insufficient documentation

## 2019-06-17 ENCOUNTER — Emergency Department
Admission: EM | Admit: 2019-06-17 | Discharge: 2019-06-17 | Disposition: A | Discharge status: Home / Self Care | Payer: BC Managed Care – PPO | Source: Home / Self Care | Attending: Family Medicine | Admitting: Family Medicine

## 2019-06-17 ENCOUNTER — Other Ambulatory Visit: Payer: Self-pay

## 2019-06-17 DIAGNOSIS — J069 Acute upper respiratory infection, unspecified: Secondary | ICD-10-CM

## 2019-06-17 DIAGNOSIS — R509 Fever, unspecified: Secondary | ICD-10-CM | POA: Diagnosis not present

## 2019-06-17 NOTE — ED Provider Notes (Signed)
[name]    CSN: [id number] Arrival date & time: 06/17/19  1053      History   Chief Complaint Chief Complaint  Patient presents with  . Fever  . Fatigue    HPI Molly Andrews is a 49 y.o. female.   Five days ago patient developed chills and fatigue.  The next day she had fever to 101.  She has had persistent myalgias.  She developed a cough 2 days ago, and a headache yesterday.  She had a rapid COVID19 test four days ago that was negative.   She denies chest tightness, shortness of breath, and changes in taste/smell.    The history is provided by the patient.    Past Medical History:  Diagnosis Date  . Diabetes mellitus without complication [facility])     Patient Active Problem List   Diagnosis Date Noted  . 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 ANXIETY 11/09/2009    Past Surgical History:  Procedure Laterality Date  . ANTERIOR CRUCIATE LIGAMENT REPAIR    . CESAREAN SECTION    . KNEE SURGERY    . MENISCUS REPAIR      OB History   No obstetric history on file.      Home Medications    Prior to Admission medications   Medication Sig Start Date End Date Taking? Authorizing Provider  cetirizine (ZYRTEC) 10 MG tablet Take 1 tablet (10 mg total) by mouth daily. 11/13/17   Lurene Shadow, PA-C  citalopram (CELEXA) 40 MG tablet  10/15/17   [provider]  FARXIGA 5 MG TABS tablet  10/15/17   [provider]    Family History Family History  Problem Relation Age of Onset  . Cancer Mother     Social History Social History   Tobacco Use  . Smoking status: Never Smoker  . Smokeless tobacco: Never Used  Substance Use Topics  . Alcohol use: Yes    Comment: 4 q wk  . Drug use: No     Allergies   Patient has no known allergies.   Review of Systems Review of Systems No sore throat + cough No pleuritic pain No wheezing No nasal congestion No post-nasal drainage No sinus pain/pressure No itchy/red eyes No earache No  hemoptysis No SOB + fever/chills No nausea No vomiting No abdominal pain No diarrhea No urinary symptoms No skin rash + fatigue + myalgias + headache   Physical Exam Triage Vital Signs ED Triage Vitals [06/17/19 1342]  Enc Vitals Group     BP 118/79     Pulse Rate 78     Resp 18     Temp 97.7 F (36.5 C)     Temp Source Oral     SpO2 98 %     Weight 232 lb (105.2 kg)     Height 5\' 3"  (1.6 m)     Head Circumference      Peak Flow      Pain Score 1     Pain Loc      Pain Edu?      Excl. in GC?    No data found.  Updated Vital Signs BP 118/79 (BP Location: Right Arm)   Pulse 78   Temp 97.7 F (36.5 C) (Oral)   Resp 18   Ht 5\' 3"  (1.6 m)   Wt 105.2 kg   LMP  (LMP Unknown)   SpO2 98%   BMI 41.10 kg/m   Visual Acuity Right Eye Distance:  Left Eye Distance:   Bilateral Distance:    Right Eye Near:   Left Eye Near:    Bilateral Near:     Physical Exam Nursing notes and Vital Signs reviewed. Appearance:  Patient appears stated age, and in no acute distress Eyes:  Pupils are equal, round, and reactive to light and accomodation.  Extraocular movement is intact.  Conjunctivae are not inflamed  Ears:  Canals normal.  Tympanic membranes normal.  Nose:  Normal turbinates.  No sinus tenderness.  Pharynx:  Normal Neck:  Supple.  Mildly enlarged lateral nodes are present, tender to palpation on the left.   Lungs:  Clear to auscultation.  Breath sounds are equal.  Moving air well. Heart:  Regular rate and rhythm without murmurs, rubs, or gallops.  Abdomen:  Nontender without masses or hepatosplenomegaly.  Bowel sounds are present.  No CVA or flank tenderness.  Extremities:  No edema.  Skin:  No rash present.   UC Treatments / Results  Labs (all labs ordered are listed, but only abnormal results are displayed) Labs Reviewed  SARS-COV-2 RNA,(COVID-19) QUALITATIVE NAAT    EKG   Radiology No results found.  Procedures Procedures (including critical care  time)  Medications Ordered in UC Medications - No data to display  Initial Impression / Assessment and Plan / UC Course  I have reviewed the triage vital signs and the nursing notes.  Pertinent labs & imaging results that were available during my care of the patient were reviewed by me and considered in my medical decision making (see chart for details).    Benign exam.  There is no evidence of bacterial infection today.  Treat symptomatically for now  COVID19 send out   Final Clinical Impressions(s) / UC Diagnoses   Final diagnoses:  Fever, unspecified  Viral URI with cough     Discharge Instructions     Take plain guaifenesin (1200mg  extended release tabs such as Mucinex) twice daily, with plenty of water, for cough and congestion.  May add Pseudoephedrine (30mg , one or two every 4 to 6 hours) for sinus congestion.  Get adequate rest.   Also recommend using saline nasal spray several times daily and saline nasal irrigation (AYR is a common brand).  Use Flonase nasal spray each morning after using Afrin nasal spray and saline nasal irrigation. Try warm salt water gargles for sore throat.  Stop all antihistamines for now, and other non-prescription cough/cold preparations. May take Ibuprofen 200mg , 4 tabs every 8 hours with food for headache, body aches, etc. May take Delsym Cough Suppressant at bedtime for nighttime cough.    Isolate yourself until COVID-19 test result is available.   If your COVID19 test is positive, then you are infected with the novel coronavirus and could give the virus to others.  Please continue isolation at home for at least 10 days since the start of your symptoms.  Once you complete your 10 day quarantine, you may return to normal activities as long as you've not had a fever for over 24 hours (without taking fever reducing medicine) and your symptoms are improving. Please continue good preventive care measures, including:  frequent hand-washing, avoid  touching your face, cover coughs/sneezes, stay out of crowds and keep a 6 foot distance from others.  Go to the nearest hospital emergency room if fever/cough/breathlessness are severe or illness seems like a threat to life.     ED Prescriptions    None        Kandra Nicolas,  MD 06/24/19 9470

## 2019-06-17 NOTE — ED Triage Notes (Signed)
Pt c/o chills, fever, and fatigue since 12/28. Was tested for covid on Wed, rapid came back neg. Still continues to have fever.

## 2019-06-17 NOTE — Discharge Instructions (Addendum)
Take plain guaifenesin (1200mg  extended release tabs such as Mucinex) twice daily, with plenty of water, for cough and congestion.  May add Pseudoephedrine (30mg , one or two every 4 to 6 hours) for sinus congestion.  Get adequate rest.   Also recommend using saline nasal spray several times daily and saline nasal irrigation (AYR is a common brand).  Use Flonase nasal spray each morning after using Afrin nasal spray and saline nasal irrigation. Try warm salt water gargles for sore throat.  Stop all antihistamines for now, and other non-prescription cough/cold preparations. May take Ibuprofen 200mg , 4 tabs every 8 hours with food for headache, body aches, etc. May take Delsym Cough Suppressant at bedtime for nighttime cough.    Isolate yourself until COVID-19 test result is available.   If your COVID19 test is positive, then you are infected with the novel coronavirus and could give the virus to others.  Please continue isolation at home for at least 10 days since the start of your symptoms.  Once you complete your 10 day quarantine, you may return to normal activities as long as you've not had a fever for over 24 hours (without taking fever reducing medicine) and your symptoms are improving. Please continue good preventive care measures, including:  frequent hand-washing, avoid touching your face, cover coughs/sneezes, stay out of crowds and keep a 6 foot distance from others.  Go to the nearest hospital emergency room if fever/cough/breathlessness are severe or illness seems like a threat to life.

## 2019-06-19 LAB — SARS-COV-2 RNA,(COVID-19) QUALITATIVE NAAT: SARS CoV2 RNA: NOT DETECTED

## 2019-12-16 DIAGNOSIS — F339 Major depressive disorder, recurrent, unspecified: Secondary | ICD-10-CM | POA: Insufficient documentation

## 2020-02-25 DIAGNOSIS — U071 COVID-19: Secondary | ICD-10-CM | POA: Insufficient documentation

## 2020-02-25 DIAGNOSIS — I10 Essential (primary) hypertension: Secondary | ICD-10-CM | POA: Insufficient documentation

## 2020-02-25 DIAGNOSIS — H409 Unspecified glaucoma: Secondary | ICD-10-CM | POA: Insufficient documentation

## 2020-02-25 DIAGNOSIS — J1282 Pneumonia due to coronavirus disease 2019: Secondary | ICD-10-CM | POA: Insufficient documentation

## 2020-02-26 DIAGNOSIS — U071 COVID-19: Secondary | ICD-10-CM | POA: Insufficient documentation

## 2020-03-07 DIAGNOSIS — I872 Venous insufficiency (chronic) (peripheral): Secondary | ICD-10-CM | POA: Insufficient documentation

## 2020-03-21 ENCOUNTER — Emergency Department: Admit: 2020-03-21 | Discharge status: Absent without leave | Payer: Self-pay

## 2020-03-21 ENCOUNTER — Telehealth: Payer: Self-pay | Admitting: Emergency Medicine

## 2020-03-21 ENCOUNTER — Other Ambulatory Visit: Payer: Self-pay

## 2020-03-21 ENCOUNTER — Encounter: Payer: Self-pay | Admitting: Emergency Medicine

## 2020-03-21 ENCOUNTER — Emergency Department
Admission: EM | Admit: 2020-03-21 | Discharge: 2020-03-21 | Disposition: A | Discharge status: Home / Self Care | Payer: BC Managed Care – PPO | Source: Home / Self Care

## 2020-03-21 ENCOUNTER — Ambulatory Visit (HOSPITAL_BASED_OUTPATIENT_CLINIC_OR_DEPARTMENT_OTHER)
Admission: RE | Admit: 2020-03-21 | Discharge: 2020-03-21 | Disposition: A | Discharge status: Home / Self Care | Payer: BC Managed Care – PPO | Source: Ambulatory Visit | Attending: Emergency Medicine | Admitting: Emergency Medicine

## 2020-03-21 DIAGNOSIS — L03116 Cellulitis of left lower limb: Secondary | ICD-10-CM | POA: Diagnosis not present

## 2020-03-21 DIAGNOSIS — F32A Depression, unspecified: Secondary | ICD-10-CM | POA: Insufficient documentation

## 2020-03-21 DIAGNOSIS — M79662 Pain in left lower leg: Secondary | ICD-10-CM | POA: Diagnosis not present

## 2020-03-21 DIAGNOSIS — M7989 Other specified soft tissue disorders: Secondary | ICD-10-CM

## 2020-03-21 DIAGNOSIS — E559 Vitamin D deficiency, unspecified: Secondary | ICD-10-CM | POA: Insufficient documentation

## 2020-03-21 MED ORDER — CEPHALEXIN 500 MG PO CAPS: 500.0000 mg | ORAL_CAPSULE | Freq: Two times a day (BID) | ORAL | 0 refills | Status: AC

## 2020-03-21 NOTE — Telephone Encounter (Signed)
Called to discuss venous US results. Unable to leave message as voicemail box is full.  Plan to have pt continue tx for cellulitis and chronic leg swelling, f/u with PCP

## 2020-03-21 NOTE — ED Provider Notes (Addendum)
[name]    CSN: [id number] Arrival date & time: 03/21/20  1559      History   Chief Complaint Chief Complaint  Patient presents with  . Leg Swelling    left  . Rash    HPI Molly Andrews is a 49 y.o. female.   HPI  Molly Andrews is a 49 y.o. female presenting to UC with c/o left lower leg swelling and redness. Hx of chronic leg swelling and rash but swelling seems worse the last 2-3 days.  She has been using topical hydrocortisone cream and another cream prescribed by her dermatologist 2 weeks ago but only minimal relief. Still has itching and soreness.  Her PCP prescribed her compression socks but she has not been wearing them.   Pt was dx and hospitalized for COVID 02/25/20 but states those symptoms had resolved. Denies cough, congestion or SOB.  She was never advised when to get her COVID vaccine so she got her first dose of Pfizer two days ago.  Mild generalized HA, low grade fever and body aches since then.  She has not taken any medications for her current symptoms. No hx of clots.    Past Medical History:  Diagnosis Date  . Diabetes mellitus without complication [facility])     Patient Active Problem List   Diagnosis Date Noted  . Depression 03/21/2020  . Vitamin D deficiency 03/21/2020  . Venous stasis dermatitis of both lower extremities 03/07/2020  . Acute hypoxemic respiratory failure due to COVID-19 (HCC) 02/26/2020  . Glaucoma 02/25/2020  . Hypertension 02/25/2020  . Pneumonia due to COVID-19 virus 02/25/2020  . Recurrent major depressive disorder (HCC) 12/16/2019  . Arthritis, midfoot 08/03/2018  . Left leg swelling 11/18/2017  . Morbid obesity with BMI of 40.0-44.9, adult (HCC) 04/23/2016  . Type 2 diabetes mellitus with hyperglycemia, without long-term current use of insulin (HCC) 04/11/2016  . Dependent edema 10/15/2015  . Gastroesophageal reflux disease 10/15/2015  . Hyperlipidemia associated with type 2 diabetes mellitus (HCC)  10/15/2015  . DDD (degenerative disc disease), lumbar 01/25/2013  . Sacral radiculitis 01/25/2013  . 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 ANXIETY 11/09/2009    Past Surgical History:  Procedure Laterality Date  . ANTERIOR CRUCIATE LIGAMENT REPAIR    . CESAREAN SECTION    . KNEE SURGERY    . MENISCUS REPAIR      OB History   No obstetric history on file.      Home Medications    Prior to Admission medications   Medication Sig Start Date End Date Taking? Authorizing Provider  ALPRAZolam Prudy Feeler) 0.25 MG tablet Take by mouth. 02/16/20  Yes [provider]  citalopram (CELEXA) 40 MG tablet  10/15/17  Yes [provider]  cyclobenzaprine (FLEXERIL) 5 MG tablet TAKE 1 TABLET AT BEDTIME ASNEEDED FOR MUSCLE SPASMS 01/21/17  Yes [provider]  Dulaglutide (TRULICITY) 0.75 MG/0.5ML SOPN INJECT 0.5ML (=0.75 MG)    SUBCUTANEOUSLY ONCE WEEKLY 11/02/19  Yes [provider]  latanoprost (XALATAN) 0.005 % ophthalmic solution Place 1 drop into both eyes at bedtime. 02/02/20  Yes [provider]  buPROPion (WELLBUTRIN XL) 300 MG 24 hr tablet Take by mouth every morning. 03/14/20   [provider]  cephALEXin (KEFLEX) 500 MG capsule Take 1 capsule (500 mg total) by mouth 2 (two) times daily for 7 days. 03/21/20 03/28/20  Lurene Shadow, PA-C  cetirizine (ZYRTEC) 10 MG tablet Take 1 tablet (10 mg total) by mouth daily. 11/13/17  Lurene Shadow, PA-C  FARXIGA 5 MG TABS tablet  10/15/17   [provider]  fluconazole (DIFLUCAN) 150 MG tablet Take 150 mg by mouth every 3 (three) days. 03/20/20   [provider]  hydrocortisone 2.5 % cream Apply topically. 02/16/20   [provider]  meloxicam (MOBIC) 15 MG tablet Take 15 mg by mouth daily. 03/14/20   [provider]  nystatin cream (MYCOSTATIN) Apply topically 2 (two) times daily. 03/05/20   [provider]    Family History Family History  Problem Relation Age of Onset  .  Cancer Mother     Social History Social History   Tobacco Use  . Smoking status: Never Smoker  . Smokeless tobacco: Never Used  Vaping Use  . Vaping Use: Never used  Substance Use Topics  . Alcohol use: Yes    Comment: 4 q wk  . Drug use: No     Allergies   Patient has no known allergies.   Review of Systems Review of Systems  Constitutional: Positive for chills and fever (low-grade).  HENT: Negative for congestion, ear pain, sore throat, trouble swallowing and voice change.   Respiratory: Negative for cough and shortness of breath.   Cardiovascular: Positive for leg swelling. Negative for chest pain and palpitations.  Gastrointestinal: Negative for abdominal pain, diarrhea, nausea and vomiting.  Musculoskeletal: Negative for arthralgias, back pain and myalgias.  Skin: Positive for rash.  All other systems reviewed and are negative.    Physical Exam Triage Vital Signs ED Triage Vitals  Enc Vitals Group     BP      Pulse      Resp      Temp      Temp src      SpO2      Weight      Height      Head Circumference      Peak Flow      Pain Score      Pain Loc      Pain Edu?      Excl. in GC?    No data found.  Updated Vital Signs BP 116/77 (BP Location: Left Arm)   Pulse 84   Temp 99.2 F (37.3 C) (Oral)   Resp 18   SpO2 97%   Visual Acuity Right Eye Distance:   Left Eye Distance:   Bilateral Distance:    Right Eye Near:   Left Eye Near:    Bilateral Near:     Physical Exam Vitals and nursing note reviewed.  Constitutional:      General: She is not in acute distress.    Appearance: Normal appearance. She is well-developed. She is obese. She is not ill-appearing, toxic-appearing or diaphoretic.  HENT:     Head: Normocephalic and atraumatic.     Right Ear: Tympanic membrane and ear canal normal.     Left Ear: Tympanic membrane and ear canal normal.     Nose: Nose normal.  Cardiovascular:     Rate and Rhythm: Normal rate and regular rhythm.    Pulmonary:     Effort: Pulmonary effort is normal. No respiratory distress.     Breath sounds: Normal breath sounds. No stridor. No wheezing, rhonchi or rales.  Musculoskeletal:        General: Swelling and tenderness ( mild lower leg tenderness, anterior and lateral) present. Normal range of motion.     Cervical back: Normal range of motion.     Right  lower leg: No edema.     Left lower leg: Edema (mild to moderate compared to RIght) present.  Skin:    General: Skin is warm and dry.     Findings: Erythema and rash present.          Comments: Left lower leg: anterior and lateral aspect: erythematous macular rash. Mildly tender. No drainage. No induration or fluctuance.   Neurological:     Mental Status: She is alert and oriented to person, place, and time.  Psychiatric:        Behavior: Behavior normal.      UC Treatments / Results  Labs (all labs ordered are listed, but only abnormal results are displayed) Labs Reviewed - No data to display  EKG   Radiology US Venous Img Lower Unilateral Left  Result Date: 03/21/2020 CLINICAL DATA:  49 year old female with left lower extremity pain and swelling. EXAM: Left LOWER EXTREMITY VENOUS DOPPLER ULTRASOUND TECHNIQUE: Gray-scale sonography with compression, as well as color and duplex ultrasound, were performed to evaluate the deep venous system(s) from the level of the common femoral vein through the popliteal and proximal calf veins. COMPARISON:  None. FINDINGS: VENOUS Normal compressibility of the common femoral, superficial femoral, and popliteal veins, as well as the visualized calf veins. Visualized portions of profunda femoral vein and great saphenous vein unremarkable. No filling defects to suggest DVT on grayscale or color Doppler imaging. Doppler waveforms show normal direction of venous flow, normal respiratory plasticity and response to augmentation. Limited views of the contralateral common femoral vein are unremarkable. OTHER  None. Limitations: none IMPRESSION: Negative. Electronically Signed   By: Elgie Collard M.D.   On: 03/21/2020 19:22    Procedures Procedures (including critical care time)  Medications Ordered in UC Medications - No data to display  Initial Impression / Assessment and Plan / UC Course  I have reviewed the triage vital signs and the nursing notes.  Pertinent labs & imaging results that were available during my care of the patient were reviewed by me and considered in my medical decision making (see chart for details).    Pt sent to [facility] for venous US Left lower leg due to worsening swelling and recent COVID dx, pt at increased risk of clotting.   Venous US negative for DVT   Encouraged continued plan to tx for mild cellulitis, close f/u with PCP next week AVS given  Final Clinical Impressions(s) / UC Diagnoses   Final diagnoses:  Cellulitis of left lower leg  Pain and swelling of left lower leg     Discharge Instructions      You have an ultrasound scheduled for this evening at [facility]. Please go through the emergency department entrance and ask to go to outpatient imaging for your ultrasound.    Please take antibiotics as prescribed and be sure to complete entire course even if you start to feel better to ensure infection does not come back.  Please use your compression socks as prescribed by your primary care provider and schedule a follow up appointment next week for recheck of symptoms.      ED Prescriptions    Medication Sig Dispense Auth. Provider   cephALEXin (KEFLEX) 500 MG capsule Take 1 capsule (500 mg total) by mouth 2 (two) times daily for 7 days. 14 capsule Lurene Shadow, [place]      PDMP not reviewed this encounter.   Lurene Shadow, PA-C 03/21/20 1929    Lurene Shadow, PA-C 03/21/20  1933  

## 2020-03-21 NOTE — Discharge Instructions (Signed)
  You have an ultrasound scheduled for this evening at [facility]. Please go through the emergency department entrance and ask to go to outpatient imaging for your ultrasound.    Please take antibiotics as prescribed and be sure to complete entire course even if you start to feel better to ensure infection does not come back.  Please use your compression socks as prescribed by your primary care provider and schedule a follow up appointment next week for recheck of symptoms.

## 2020-03-21 NOTE — ED Triage Notes (Signed)
LLE swelling - chronic seems worse the last few days  Also has a red raised rash to left lower leg - PCP aware of swelling and rash - sent to dermatology 2 weeks ago, given prescription cream - started getting better, now worse Had 1st COVID vaccine, next one due on Oct 27th Had COVID mid September - no infusions per pt

## 2020-03-22 ENCOUNTER — Telehealth: Payer: Self-pay | Admitting: Emergency Medicine

## 2020-03-22 NOTE — Telephone Encounter (Signed)
Call to patient to update her on ultrasound results. Provider Waylan Rocher, [place]) unable to reach patient yesterday w/ results. Per provider results were normal. No other questions at this time

## 2020-10-27 ENCOUNTER — Other Ambulatory Visit: Payer: Self-pay

## 2020-10-27 ENCOUNTER — Emergency Department
Admission: EM | Admit: 2020-10-27 | Discharge: 2020-10-27 | Disposition: A | Discharge status: Home / Self Care | Payer: BC Managed Care – PPO | Source: Home / Self Care

## 2020-10-27 ENCOUNTER — Encounter: Payer: Self-pay | Admitting: Emergency Medicine

## 2020-10-27 DIAGNOSIS — R059 Cough, unspecified: Secondary | ICD-10-CM

## 2020-10-27 DIAGNOSIS — J309 Allergic rhinitis, unspecified: Secondary | ICD-10-CM

## 2020-10-27 DIAGNOSIS — J0100 Acute maxillary sinusitis, unspecified: Secondary | ICD-10-CM

## 2020-10-27 DIAGNOSIS — J3489 Other specified disorders of nose and nasal sinuses: Secondary | ICD-10-CM

## 2020-10-27 MED ORDER — METHYLPREDNISOLONE ACETATE 80 MG/ML IJ SUSP
80.0000 mg | Freq: Once | INTRAMUSCULAR | Status: AC
  Administered 2020-10-27: 80 mg via INTRAMUSCULAR

## 2020-10-27 MED ORDER — PREDNISONE 20 MG PO TABS: ORAL_TABLET | ORAL | 0 refills | Status: DC

## 2020-10-27 MED ORDER — AMOXICILLIN-POT CLAVULANATE 875-125 MG PO TABS: 1.0000 | ORAL_TABLET | Freq: Two times a day (BID) | ORAL | 0 refills | Status: DC

## 2020-10-27 MED ORDER — FEXOFENADINE HCL 180 MG PO TABS: 180.0000 mg | ORAL_TABLET | Freq: Every day | ORAL | 0 refills | Status: DC

## 2020-10-27 MED ORDER — BENZONATATE 200 MG PO CAPS: 200.0000 mg | ORAL_CAPSULE | Freq: Three times a day (TID) | ORAL | 0 refills | Status: AC | PRN

## 2020-10-27 NOTE — ED Provider Notes (Signed)
[name]    CSN: [id number] Arrival date & time: 10/27/20  0943      History   Chief Complaint Chief Complaint  Patient presents with  . Cough    HPI Molly Andrews is a 50 y.o. female.   HPI 50 year old female presents with non-productive cough, sinus nasal congestion, sinus pressure, postnasal drainage, and runny nose for 7-8 days.  Denies fever reports taking OTC Delsym with little to no relief.  Patient is vaccinated for COVID-19.  Past Medical History:  Diagnosis Date  . Diabetes mellitus without complication [facility])     Patient Active Problem List   Diagnosis Date Noted  . Depression 03/21/2020  . Vitamin D deficiency 03/21/2020  . Venous stasis dermatitis of both lower extremities 03/07/2020  . Acute hypoxemic respiratory failure due to COVID-19 (HCC) 02/26/2020  . Glaucoma 02/25/2020  . Hypertension 02/25/2020  . Pneumonia due to COVID-19 virus 02/25/2020  . Recurrent major depressive disorder (HCC) 12/16/2019  . Arthritis, midfoot 08/03/2018  . Left leg swelling 11/18/2017  . Morbid obesity with BMI of 40.0-44.9, adult (HCC) 04/23/2016  . Type 2 diabetes mellitus with hyperglycemia, without long-term current use of insulin (HCC) 04/11/2016  . Dependent edema 10/15/2015  . Gastroesophageal reflux disease 10/15/2015  . Hyperlipidemia associated with type 2 diabetes mellitus (HCC) 10/15/2015  . DDD (degenerative disc disease), lumbar 01/25/2013  . Sacral radiculitis 01/25/2013  . 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 ANXIETY 11/09/2009    Past Surgical History:  Procedure Laterality Date  . ANTERIOR CRUCIATE LIGAMENT REPAIR    . CESAREAN SECTION    . KNEE SURGERY    . MENISCUS REPAIR      OB History   No obstetric history on file.      Home Medications    Prior to Admission medications   Medication Sig Start Date End Date Taking? Authorizing Provider  ALPRAZolam Prudy Feeler) 0.25 MG tablet Take by mouth. 02/16/20  Yes [provider]   amoxicillin-clavulanate (AUGMENTIN) 875-125 MG tablet Take 1 tablet by mouth every 12 (twelve) hours. 10/27/20  Yes Molly Iha, FNP  benzonatate (TESSALON) 200 MG capsule Take 1 capsule (200 mg total) by mouth 3 (three) times daily as needed for up to 7 days for cough. 10/27/20 11/03/20 Yes Molly Iha, FNP  buPROPion (WELLBUTRIN XL) 300 MG 24 hr tablet Take by mouth every morning. 03/14/20  Yes [provider]  citalopram (CELEXA) 40 MG tablet  10/15/17  Yes [provider]  cyclobenzaprine (FLEXERIL) 5 MG tablet TAKE 1 TABLET AT BEDTIME ASNEEDED FOR MUSCLE SPASMS 01/21/17  Yes [provider]  Dulaglutide (TRULICITY) 0.75 MG/0.5ML SOPN INJECT 0.5ML (=0.75 MG)    SUBCUTANEOUSLY ONCE WEEKLY 11/02/19  Yes [provider]  FARXIGA 5 MG TABS tablet  10/15/17  Yes [provider]  fexofenadine (ALLEGRA ALLERGY) 180 MG tablet Take 1 tablet (180 mg total) by mouth daily for 15 days. 10/27/20 11/11/20 Yes Molly Iha, FNP  hydrocortisone 2.5 % cream Apply topically. 02/16/20  Yes [provider]  latanoprost (XALATAN) 0.005 % ophthalmic solution Place 1 drop into both eyes at bedtime. 02/02/20  Yes [provider]  meloxicam (MOBIC) 15 MG tablet Take 15 mg by mouth daily. 03/14/20  Yes [provider]  predniSONE (DELTASONE) 20 MG tablet Take 2 tabs PO daily x 5 days. 10/27/20  Yes Molly Iha, FNP  cetirizine (ZYRTEC) 10 MG tablet Take 1 tablet (10 mg total) by mouth daily. 11/13/17 10/27/20 Yes Lurene Shadow,  PA-C  fluconazole (DIFLUCAN) 150 MG tablet Take 150 mg by mouth every 3 (three) days. 03/20/20   [provider]  nystatin cream (MYCOSTATIN) Apply topically 2 (two) times daily. 03/05/20   [provider]    Family History Family History  Problem Relation Age of Onset  . Cancer Mother     Social History Social History   Tobacco Use  . Smoking status: Never Smoker  . Smokeless tobacco: Never Used  Vaping  Use  . Vaping Use: Never used  Substance Use Topics  . Alcohol use: Yes    Comment: 4 q wk  . Drug use: No     Allergies   Patient has no known allergies.   Review of Systems Review of Systems  Constitutional: Negative.   HENT: Positive for postnasal drip, rhinorrhea, sinus pressure and sinus pain.   Eyes: Negative.   Respiratory: Positive for cough.   Cardiovascular: Negative.   Gastrointestinal: Negative.   Genitourinary: Negative.   Musculoskeletal: Negative.   Skin: Negative.   Neurological: Negative.      Physical Exam Triage Vital Signs ED Triage Vitals  Enc Vitals Group     BP 10/27/20 1130 135/86     Pulse Rate 10/27/20 1130 96     Resp --      Temp 10/27/20 1130 99.3 F (37.4 C)     Temp Source 10/27/20 1130 Oral     SpO2 10/27/20 1130 95 %     Weight --      Height --      Head Circumference --      Peak Flow --      Pain Score 10/27/20 1132 6     Pain Loc --      Pain Edu? --      Excl. in GC? --    No data found.  Updated Vital Signs BP 135/86 (BP Location: Right Arm)   Pulse 96   Temp 99.3 F (37.4 C) (Oral)   SpO2 95%   Physical Exam Vitals and nursing note reviewed.  Constitutional:      General: She is not in acute distress.    Appearance: Normal appearance. She is obese. She is ill-appearing.  HENT:     Head: Normocephalic and atraumatic.     Right Ear: Hearing, ear canal and external ear normal. Tympanic membrane is retracted.     Left Ear: Hearing, ear canal and external ear normal. Tympanic membrane is retracted.     Ears:     Comments: Eustachian tube dysfunction noted bilaterally    Nose:     Right Turbinates: Enlarged.     Left Turbinates: Enlarged.     Right Sinus: Maxillary sinus tenderness present.     Left Sinus: Maxillary sinus tenderness present.     Comments: Turbinates are erythematous    Mouth/Throat:     Lips: Pink.     Mouth: Mucous membranes are moist.     Pharynx: Oropharynx is clear. Uvula midline. No  pharyngeal swelling, oropharyngeal exudate, posterior oropharyngeal erythema or uvula swelling.     Comments: Moderate to significant clear drainage of posterior oropharynx noted Cardiovascular:     Rate and Rhythm: Normal rate and regular rhythm.     Pulses: Normal pulses.     Heart sounds: Normal heart sounds.  Pulmonary:     Effort: Pulmonary effort is normal. No respiratory distress.     Breath sounds: Normal breath sounds. No stridor. No wheezing, rhonchi or rales.  Comments: No adventitious breath sounds, infrequent nonproductive cough noted Musculoskeletal:     Cervical back: Normal range of motion and neck supple. No tenderness.  Lymphadenopathy:     Cervical: No cervical adenopathy.  Skin:    General: Skin is warm and dry.  Neurological:     General: No focal deficit present.     Mental Status: She is alert and oriented to person, place, and time.  Psychiatric:        Mood and Affect: Mood normal.        Behavior: Behavior normal.      UC Treatments / Results  Labs (all labs ordered are listed, but only abnormal results are displayed) Labs Reviewed - No data to display  EKG   Radiology No results found.  Procedures Procedures (including critical care time)  Medications Ordered in UC Medications  methylPREDNISolone acetate (DEPO-MEDROL) injection 80 mg (80 mg Intramuscular Given 10/27/20 1212)    Initial Impression / Assessment and Plan / UC Course  I have reviewed the triage vital signs and the nursing notes.  Pertinent labs & imaging results that were available during my care of the patient were reviewed by me and considered in my medical decision making (see chart for details).    MDM: 1.  Acute maxillary sinusitis, 2.  Sinus pressure, 3.  Allergic rhinitis, 4.  Cough.  Patient discharged home, hemodynamically stable Final Clinical Impressions(s) / UC Diagnoses   Final diagnoses:  Cough  Acute maxillary sinusitis, recurrence not specified  Sinus  pressure  Allergic rhinitis, unspecified seasonality, unspecified trigger     Discharge Instructions     Advised/instructed patient to take medication with food to completion.  Please increase daily water intake while taking these medications.  Advised/instructed patient may use Tessalon Perles daily, or as needed for cough.    ED Prescriptions    Medication Sig Dispense Auth. Provider   amoxicillin-clavulanate (AUGMENTIN) 875-125 MG tablet Take 1 tablet by mouth every 12 (twelve) hours. 14 tablet Molly Iha, FNP   predniSONE (DELTASONE) 20 MG tablet Take 2 tabs PO daily x 5 days. 10 tablet Molly Iha, FNP   fexofenadine [facility] ALLERGY) 180 MG tablet Take 1 tablet (180 mg total) by mouth daily for 15 days. 15 tablet Molly Iha, FNP   benzonatate (TESSALON) 200 MG capsule Take 1 capsule (200 mg total) by mouth 3 (three) times daily as needed for up to 7 days for cough. 30 capsule Molly Iha, FNP     PDMP not reviewed this encounter.   Molly Iha, FNP 10/27/20 1229

## 2020-10-27 NOTE — Discharge Instructions (Signed)
Advised/instructed patient to take medication with food to completion.  Please increase daily water intake while taking these medications.  Advised/instructed patient may use Tessalon Perles daily, or as needed for cough.

## 2020-10-27 NOTE — ED Triage Notes (Signed)
Patient states that she's been having productive cough and runny nose x 2 days.  Denies fever.  Has taken Delsym OTC.  Patient is vaccinated.

## 2021-06-06 LAB — COLOGUARD: COLOGUARD: NEGATIVE

## 2022-10-16 ENCOUNTER — Ambulatory Visit
Admission: EM | Admit: 2022-10-16 | Discharge: 2022-10-16 | Disposition: A | Discharge status: Home / Self Care | Payer: BC Managed Care – PPO | Attending: Family Medicine | Admitting: Family Medicine

## 2022-10-16 DIAGNOSIS — J309 Allergic rhinitis, unspecified: Secondary | ICD-10-CM | POA: Diagnosis not present

## 2022-10-16 DIAGNOSIS — R059 Cough, unspecified: Secondary | ICD-10-CM

## 2022-10-16 DIAGNOSIS — J40 Bronchitis, not specified as acute or chronic: Secondary | ICD-10-CM

## 2022-10-16 MED ORDER — FEXOFENADINE HCL 180 MG PO TABS: 180.0000 mg | ORAL_TABLET | Freq: Every day | ORAL | 0 refills | Status: DC

## 2022-10-16 MED ORDER — DOXYCYCLINE HYCLATE 100 MG PO CAPS: 100.0000 mg | ORAL_CAPSULE | Freq: Two times a day (BID) | ORAL | 0 refills | Status: AC

## 2022-10-16 MED ORDER — BENZONATATE 200 MG PO CAPS: 200.0000 mg | ORAL_CAPSULE | Freq: Three times a day (TID) | ORAL | 0 refills | Status: AC | PRN

## 2022-10-16 MED ORDER — PREDNISONE 10 MG (21) PO TBPK: ORAL_TABLET | Freq: Every day | ORAL | 0 refills | Status: DC

## 2022-10-16 MED ORDER — PROMETHAZINE-DM 6.25-15 MG/5ML PO SYRP: 5.0000 mL | ORAL_SOLUTION | Freq: Two times a day (BID) | ORAL | 0 refills | Status: DC | PRN

## 2022-10-16 NOTE — Discharge Instructions (Addendum)
Patient to take medications as directed with food to completion.  Advised patient to take Allegra and Prednisone with first dose of Doxycycline for the next 10 days.  Advised may discontinue Allegra if postnasal drainage/drip has resolved.  Advised may use Tessalon daily or as needed for cough.  Advised may use Promethazine DM at night for cough due to sedative effects.  Advised patient not to use cough medications together.  Encouraged increase daily water intake to 64 ounces per day while taking these medications.  Advised if symptoms worsen and/or unresolved please follow-up with PCP or here for further evaluation.

## 2022-10-16 NOTE — ED Provider Notes (Signed)
M[facility]    CSN: [id number] Arrival date & time: 10/16/22  0948      History   Chief Complaint Chief Complaint  Patient presents with   Cough   Nasal Congestion    HPI Molly Andrews is a 52 y.o. female.   HPI Very pleasant 52 year old female presents with cough and nasal congestion for 7-8 days.  PMH significant for obesity, HTN, and previous history of pneumonia.  Past Medical History:  Diagnosis Date   Diabetes mellitus without complication (HCC)     Patient Active Problem List   Diagnosis Date Noted   Depression 03/21/2020   Vitamin D deficiency 03/21/2020   Venous stasis dermatitis of both lower extremities 03/07/2020   Acute hypoxemic respiratory failure due to COVID-19 [facility]) 02/26/2020   Glaucoma 02/25/2020   Hypertension 02/25/2020   Pneumonia due to COVID-19 virus 02/25/2020   Recurrent major depressive disorder (HCC) 12/16/2019   Arthritis, midfoot 08/03/2018   Left leg swelling 11/18/2017   Morbid obesity with BMI of 40.0-44.9, adult (HCC) 04/23/2016   Type 2 diabetes mellitus with hyperglycemia, without long-term current use of insulin (HCC) 04/11/2016   Dependent edema 10/15/2015   Gastroesophageal reflux disease 10/15/2015   Hyperlipidemia associated with type 2 diabetes mellitus (HCC) 10/15/2015   DDD (degenerative disc disease), lumbar 01/25/2013   Sacral radiculitis 01/25/2013   HERPES ZOSTER 11/09/2009   ANXIETY 11/09/2009    Past Surgical History:  Procedure Laterality Date   ANTERIOR CRUCIATE LIGAMENT REPAIR     CESAREAN SECTION     KNEE SURGERY     MENISCUS REPAIR      OB History   No obstetric history on file.      Home Medications    Prior to Admission medications   Medication Sig Start Date End Date Taking? Authorizing Provider  benzonatate (TESSALON) 200 MG capsule Take 1 capsule (200 mg total) by mouth 3 (three) times daily as needed for up to 7 days. 10/16/22 10/23/22 Yes Trevor Iha, FNP  doxycycline  (VIBRAMYCIN) 100 MG capsule Take 1 capsule (100 mg total) by mouth 2 (two) times daily for 10 days. 10/16/22 10/26/22 Yes Trevor Iha, FNP  fexofenadine [facility] ALLERGY) 180 MG tablet Take 1 tablet (180 mg total) by mouth daily for 15 days. 10/16/22 10/31/22 Yes Trevor Iha, FNP  predniSONE (STERAPRED UNI-PAK 21 TAB) 10 MG (21) TBPK tablet Take by mouth daily. Take 6 tabs by mouth daily  for 2 days, then 5 tabs for 2 days, then 4 tabs for 2 days, then 3 tabs for 2 days, 2 tabs for 2 days, then 1 tab by mouth daily for 2 days 10/16/22  Yes Trevor Iha, FNP  promethazine-dextromethorphan (PROMETHAZINE-DM) 6.25-15 MG/5ML syrup Take 5 mLs by mouth 2 (two) times daily as needed for cough. 10/16/22  Yes Trevor Iha, FNP  ALPRAZolam Prudy Feeler) 0.25 MG tablet Take by mouth. 02/16/20   [provider]  buPROPion (WELLBUTRIN XL) 300 MG 24 hr tablet Take by mouth every morning. 03/14/20   [provider]  citalopram (CELEXA) 40 MG tablet  10/15/17   [provider]  cyclobenzaprine (FLEXERIL) 5 MG tablet TAKE 1 TABLET AT BEDTIME ASNEEDED FOR MUSCLE SPASMS 01/21/17   [provider]  Dulaglutide (TRULICITY) 0.75 MG/0.5ML SOPN INJECT 0.5ML (=0.75 MG)    SUBCUTANEOUSLY ONCE WEEKLY 11/02/19   [provider]  FARXIGA 5 MG TABS tablet  10/15/17   [provider]  fluconazole (DIFLUCAN) 150 MG tablet Take 150 mg by  mouth every 3 (three) days. 03/20/20   [provider]  hydrocortisone 2.5 % cream Apply topically. 02/16/20   [provider]  latanoprost (XALATAN) 0.005 % ophthalmic solution Place 1 drop into both eyes at bedtime. 02/02/20   [provider]  meloxicam (MOBIC) 15 MG tablet Take 15 mg by mouth daily. 03/14/20   [provider]  nystatin cream (MYCOSTATIN) Apply topically 2 (two) times daily. 03/05/20   [provider]  cetirizine (ZYRTEC) 10 MG tablet Take 1 tablet (10 mg total) by mouth daily. 11/13/17 10/27/20  Lurene Shadow, PA-C    Family History Family History  Problem Relation Age of Onset   Cancer Mother     Social History Social History   Tobacco Use   Smoking status: Never   Smokeless tobacco: Never  Vaping Use   Vaping Use: Never used  Substance Use Topics   Alcohol use: Yes    Comment: 4 q wk   Drug use: No     Allergies   Patient has no known allergies.   Review of Systems Review of Systems  HENT:  Positive for congestion.   Respiratory:  Positive for cough.   All other systems reviewed and are negative.    Physical Exam Triage Vital Signs ED Triage Vitals  Enc Vitals Group     BP 10/16/22 1004 134/82     Pulse Rate 10/16/22 1004 76     Resp 10/16/22 1004 18     Temp 10/16/22 1004 98.1 F (36.7 C)     Temp Source 10/16/22 1004 Oral     SpO2 10/16/22 1004 96 %     Weight --      Height --      Head Circumference --      Peak Flow --      Pain Score 10/16/22 1007 0     Pain Loc --      Pain Edu? --      Excl. in GC? --    No data found.  Updated Vital Signs BP 134/82 (BP Location: Right Arm)   Pulse 76   Temp 98.1 F (36.7 C) (Oral)   Resp 18   LMP  (LMP Unknown)   SpO2 96%      Physical Exam Vitals and nursing note reviewed.  Constitutional:      Appearance: Normal appearance. She is obese. She is ill-appearing.  HENT:     Head: Normocephalic and atraumatic.     Right Ear: Tympanic membrane, ear canal and external ear normal.     Left Ear: Tympanic membrane, ear canal and external ear normal.     Mouth/Throat:     Mouth: Mucous membranes are moist.     Pharynx: Oropharynx is clear.     Comments: Significant amount of clear drainage of posterior oropharynx noted Eyes:     Extraocular Movements: Extraocular movements intact.     Conjunctiva/sclera: Conjunctivae normal.     Pupils: Pupils are equal, round, and reactive to light.  Cardiovascular:     Rate and Rhythm: Normal rate and regular rhythm.     Pulses: Normal pulses.     Heart sounds:  Normal heart sounds.  Pulmonary:     Effort: Pulmonary effort is normal.     Breath sounds: Rhonchi present. No wheezing or rales.     Comments: Mild diffuse scattered rhonchi noted throughout, infrequent nonproductive cough on exam Musculoskeletal:        General: Normal range  of motion.     Cervical back: Normal range of motion and neck supple.  Skin:    General: Skin is warm and dry.  Neurological:     General: No focal deficit present.     Mental Status: She is alert. Mental status is at baseline. She is disoriented.      UC Treatments / Results  Labs (all labs ordered are listed, but only abnormal results are displayed) Labs Reviewed - No data to display  EKG   Radiology No results found.  Procedures Procedures (including critical care time)  Medications Ordered in UC Medications - No data to display  Initial Impression / Assessment and Plan / UC Course  I have reviewed the triage vital signs and the nursing notes.  Pertinent labs & imaging results that were available during my care of the patient were reviewed by me and considered in my medical decision making (see chart for details).     MDM: 1.  Bronchitis-Rx'd Sterapred Unipak (tapering from 60 mg to 10 mg over 10 days; 2.  Cough-Rx'd Doxycycline 100 mg twice daily x 10 days, Tessalon 200 mg 3 times daily, as needed for cough, Promethazine DM 6.25-15 mg / 5 mL-take 5 mL twice daily, as needed for cough. Patient to take medications as directed with food to completion.  Advised patient to take Allegra and Prednisone with first dose of Doxycycline for the next 10 days.  Advised may discontinue Allegra if postnasal drainage/drip has resolved.  Advised may use Tessalon daily or as needed for cough.  Advised may use Promethazine DM at night for cough due to sedative effects.  Advised patient not to use cough medications together.  Encouraged increase daily water intake to 64 ounces per day while taking these medications.   Advised if symptoms worsen and/or unresolved please follow-up with PCP or here for further evaluation.  Discharged home, hemodynamically stable. Final Clinical Impressions(s) / UC Diagnoses   Final diagnoses:  Bronchitis  Cough, unspecified type  Allergic rhinitis, unspecified seasonality, unspecified trigger     Discharge Instructions      Patient to take medications as directed with food to completion.  Advised patient to take Allegra and Prednisone with first dose of Doxycycline for the next 10 days.  Advised may discontinue Allegra if postnasal drainage/drip has resolved.  Advised may use Tessalon daily or as needed for cough.  Advised may use Promethazine DM at night for cough due to sedative effects.  Advised patient not to use cough medications together.  Encouraged increase daily water intake to 64 ounces per day while taking these medications.  Advised if symptoms worsen and/or unresolved please follow-up with PCP or here for further evaluation.     ED Prescriptions     Medication Sig Dispense Auth. Provider   doxycycline (VIBRAMYCIN) 100 MG capsule Take 1 capsule (100 mg total) by mouth 2 (two) times daily for 10 days. 20 capsule Trevor Iha, FNP   predniSONE (STERAPRED UNI-PAK 21 TAB) 10 MG (21) TBPK tablet Take by mouth daily. Take 6 tabs by mouth daily  for 2 days, then 5 tabs for 2 days, then 4 tabs for 2 days, then 3 tabs for 2 days, 2 tabs for 2 days, then 1 tab by mouth daily for 2 days 42 tablet Trevor Iha, FNP   fexofenadine [facility] ALLERGY) 180 MG tablet Take 1 tablet (180 mg total) by mouth daily for 15 days. 15 tablet Trevor Iha, FNP   benzonatate (TESSALON) 200 MG capsule  Take 1 capsule (200 mg total) by mouth 3 (three) times daily as needed for up to 7 days. 40 capsule Trevor Iha, FNP   promethazine-dextromethorphan (PROMETHAZINE-DM) 6.25-15 MG/5ML syrup Take 5 mLs by mouth 2 (two) times daily as needed for cough. 118 mL Trevor Iha, FNP       PDMP not reviewed this encounter.   Trevor Iha, FNP 10/16/22 1058

## 2022-10-16 NOTE — ED Triage Notes (Signed)
Patient presents to UC for cough and nasal congestion since last Friday. Taking mucinex.   Denies fever or SOB.

## 2024-05-07 ENCOUNTER — Ambulatory Visit

## 2024-05-07 ENCOUNTER — Ambulatory Visit
Admission: RE | Admit: 2024-05-07 | Discharge: 2024-05-07 | Disposition: A | Discharge status: Home / Self Care | Source: Ambulatory Visit | Attending: Family Medicine | Admitting: Family Medicine

## 2024-05-07 VITALS — BP 142/90 | HR 93 | Temp 98.8°F | Resp 18

## 2024-05-07 DIAGNOSIS — M25572 Pain in left ankle and joints of left foot: Secondary | ICD-10-CM

## 2024-05-07 MED ORDER — NAPROXEN 500 MG PO TABS: 500.0000 mg | ORAL_TABLET | Freq: Two times a day (BID) | ORAL | 0 refills | Status: AC

## 2024-05-07 NOTE — ED Triage Notes (Signed)
 Patient presents to Urgent Care with complaints of left ankle pain since 1 day ago. Patient reports unsure if stepped wrong. Unable to put all weight on foot. Walking in today on crutches. Pain with flexion. Took Ibuprofen for pain with minimum relief.

## 2024-05-07 NOTE — Discharge Instructions (Addendum)
 Limit walking while foot is painful Use ice and elevation to reduce pain and swelling Take naproxen  2 times a day with food Wear brace until you can walk comfortably without it Follow-up with your doctor if not improving in a couple days

## 2024-05-07 NOTE — ED Provider Notes (Signed)
 Molly Andrews CARE    CSN: [id number] Arrival date & time: 05/07/24  1055      History   Chief Complaint Chief Complaint  Patient presents with   Foot Injury    I cant put pressure on my left foot. I might have sprained my ankle - Entered by patient   Ankle Pain    left    HPI Molly Andrews is a 53 y.o. female.   HPI Patient states that she stepped wrong going on her back door and may have sprained her ankle.  She states is very painful with any weightbearing.  She does swollen.  She has not taken any medication.  No history of fracture.  She does have some arthritis in her midfoot.  Pain is in the lateral ankle. Past Medical History:  Diagnosis Date   Diabetes mellitus without complication (HCC)     Patient Active Problem List   Diagnosis Date Noted   Depression 03/21/2020   Vitamin D deficiency 03/21/2020   Venous stasis dermatitis of both lower extremities 03/07/2020   Acute hypoxemic respiratory failure due to COVID-19 (HCC) 02/26/2020   Glaucoma 02/25/2020   Hypertension 02/25/2020   Pneumonia due to COVID-19 virus 02/25/2020   Recurrent major depressive disorder 12/16/2019   Arthritis, midfoot 08/03/2018   Left leg swelling 11/18/2017   Morbid obesity with BMI of 40.0-44.9, adult (HCC) 04/23/2016   Type 2 diabetes mellitus with hyperglycemia, without long-term current use of insulin (HCC) 04/11/2016   Dependent edema 10/15/2015   Gastroesophageal reflux disease 10/15/2015   Hyperlipidemia associated with type 2 diabetes mellitus (HCC) 10/15/2015   DDD (degenerative disc disease), lumbar 01/25/2013   Sacral radiculitis 01/25/2013   Herpes zoster 11/09/2009   Anxiety state 11/09/2009    Past Surgical History:  Procedure Laterality Date   ANTERIOR CRUCIATE LIGAMENT REPAIR     CESAREAN SECTION     KNEE SURGERY     MENISCUS REPAIR      OB History   No obstetric history on file.      Home Medications    Prior to Admission medications    Medication Sig Start Date End Date Taking? Authorizing Provider  citalopram (CELEXA) 40 MG tablet  10/15/17  Yes [provider]  cyclobenzaprine (FLEXERIL) 5 MG tablet TAKE 1 TABLET AT BEDTIME ASNEEDED FOR MUSCLE SPASMS 01/21/17  Yes [provider]  insulin degludec (TRESIBA FLEXTOUCH) 100 UNIT/ML FlexTouch Pen Inject 35 units into the skin ONCE every night. May titrate up to 50 units MAX daily. 07/01/23  Yes [provider]  latanoprost (XALATAN) 0.005 % ophthalmic solution Place 1 drop into both eyes at bedtime. 02/02/20  Yes [provider]  MOUNJARO 7.5 MG/0.5ML Pen Inject 7.5 mg into the skin. 01/12/24  Yes [provider]  naproxen  (NAPROSYN ) 500 MG tablet Take 1 tablet (500 mg total) by mouth 2 (two) times daily. 05/07/24  Yes Maranda Jamee Jacob, MD  hydrocortisone 2.5 % cream Apply topically. 02/16/20   [provider]  nystatin cream (MYCOSTATIN) Apply topically 2 (two) times daily. 03/05/20   [provider]  cetirizine  (ZYRTEC ) 10 MG tablet Take 1 tablet (10 mg total) by mouth daily. 11/13/17 10/27/20  Anitra Rocky KIDD, PA-C    Family History Family History  Problem Relation Age of Onset   Cancer Mother     Social History Social History   Tobacco Use   Smoking status: Never   Smokeless tobacco: Never  Vaping Use   Vaping status:  Never Used  Substance Use Topics   Alcohol use: Yes    Comment: 4 q wk   Drug use: No     Allergies   Patient has no known allergies.   Review of Systems Review of Systems See HPI  Physical Exam Triage Vital Signs ED Triage Vitals  Encounter Vitals Group     BP 05/07/24 1111 (!) 142/90     Girls Systolic BP Percentile --      Girls Diastolic BP Percentile --      Boys Systolic BP Percentile --      Boys Diastolic BP Percentile --      Pulse Rate 05/07/24 1111 93     Resp 05/07/24 1111 18     Temp 05/07/24 1111 98.8 F (37.1 C)     Temp Source 05/07/24 1111 Oral     SpO2  05/07/24 1111 99 %     Weight --      Height --      Head Circumference --      Peak Flow --      Pain Score 05/07/24 1108 6     Pain Loc --      Pain Education --      Exclude from Growth Chart --    No data found.  Updated Vital Signs BP (!) 142/90 (BP Location: Right Arm)   Pulse 93   Temp 98.8 F (37.1 C) (Oral)   Resp 18   LMP  (LMP Unknown)   SpO2 99%       Physical Exam Constitutional:      General: She is not in acute distress.    Appearance: She is well-developed.  HENT:     Head: Normocephalic and atraumatic.  Eyes:     Conjunctiva/sclera: Conjunctivae normal.     Pupils: Pupils are equal, round, and reactive to light.  Cardiovascular:     Rate and Rhythm: Normal rate.  Pulmonary:     Effort: Pulmonary effort is normal. No respiratory distress.  Abdominal:     General: There is no distension.     Palpations: Abdomen is soft.  Musculoskeletal:        General: Tenderness and signs of injury present. No swelling or deformity. Normal range of motion.     Cervical back: Normal range of motion.     Comments: Tenderness just posterior to the lateral malleolus and over the ATFL.  No soft tissue swelling noted.  No discoloration noted.  Limited range of motion secondary to pain.  No instability.  Patient cannot fully weight-bear secondary to pain  Skin:    General: Skin is warm and dry.  Neurological:     Mental Status: She is alert.      UC Treatments / Results  Labs (all labs ordered are listed, but only abnormal results are displayed) Labs Reviewed - No data to display  EKG   Radiology DG Ankle Complete Left Result Date: 05/07/2024 EXAM: 3 OR MORE VIEW(S) XRAY OF THE LEFT ANKLE 05/07/2024 11:35:00 AM CLINICAL HISTORY: lateral ankle pain COMPARISON: None available. FINDINGS: BONES AND JOINTS: Degenerative changes identified within the midfoot tarsal bones. Small posterior calcaneal heel spur. No acute fracture. No joint dislocation. SOFT TISSUES: The  soft tissues are unremarkable. IMPRESSION: 1. No acute abnormality. Electronically signed by: Waddell Calk MD 05/07/2024 11:38 AM EST RP Workstation: [id number]    Procedures Procedures (including critical care time)  Medications Ordered in UC Medications - No data to display  Initial Impression /  Assessment and Plan / UC Course  I have reviewed the triage vital signs and the nursing notes.  Pertinent labs & imaging results that were available during my care of the patient were reviewed by me and considered in my medical decision making (see chart for details).     Final Clinical Impressions(s) / UC Diagnoses   Final diagnoses:  Acute left ankle pain     Discharge Instructions      Limit walking while foot is painful Use ice and elevation to reduce pain and swelling Take naproxen  2 times a day with food Wear brace until you can walk comfortably without it Follow-up with your doctor if not improving in a couple days   ED Prescriptions     Medication Sig Dispense Auth. Provider   naproxen  (NAPROSYN ) 500 MG tablet Take 1 tablet (500 mg total) by mouth 2 (two) times daily. 30 tablet Maranda Jamee Jacob, MD      PDMP not reviewed this encounter.   Maranda Jamee Jacob, MD 05/07/24 1400
